# Patient Record
Sex: Female | Born: 1965 | Race: White | Hispanic: No | Marital: Married | Smoking: Never smoker
Health system: Southern US, Community
[De-identification: ages and names within clinical notes are randomized; demographics above are authoritative.]

## PROBLEM LIST (undated history)

## (undated) DIAGNOSIS — I10 Essential (primary) hypertension: Secondary | ICD-10-CM

## (undated) DIAGNOSIS — C801 Malignant (primary) neoplasm, unspecified: Secondary | ICD-10-CM

## (undated) HISTORY — PX: ABDOMINAL HYSTERECTOMY: SHX81

---

## 1997-10-02 ENCOUNTER — Ambulatory Visit (HOSPITAL_BASED_OUTPATIENT_CLINIC_OR_DEPARTMENT_OTHER): Admission: RE | Admit: 1997-10-02 | Discharge: 1997-10-02 | Payer: Self-pay | Admitting: General Surgery

## 1997-11-24 ENCOUNTER — Emergency Department (HOSPITAL_COMMUNITY): Admission: EM | Admit: 1997-11-24 | Discharge: 1997-11-24 | Payer: Self-pay | Admitting: Emergency Medicine

## 1998-02-10 ENCOUNTER — Ambulatory Visit (HOSPITAL_COMMUNITY): Admission: RE | Admit: 1998-02-10 | Discharge: 1998-02-10 | Payer: Self-pay | Admitting: Gastroenterology

## 1999-02-18 ENCOUNTER — Emergency Department (HOSPITAL_COMMUNITY): Admission: EM | Admit: 1999-02-18 | Discharge: 1999-02-18 | Payer: Self-pay | Admitting: Podiatry

## 1999-02-18 ENCOUNTER — Encounter: Payer: Self-pay | Admitting: General Surgery

## 1999-03-21 HISTORY — PX: NEPHRECTOMY TRANSPLANTED ORGAN: SUR880

## 2000-06-24 ENCOUNTER — Emergency Department (HOSPITAL_COMMUNITY): Admission: EM | Admit: 2000-06-24 | Discharge: 2000-06-24 | Payer: Self-pay

## 2020-12-30 ENCOUNTER — Emergency Department (HOSPITAL_COMMUNITY)
Admission: EM | Admit: 2020-12-30 | Discharge: 2020-12-30 | Disposition: A | Discharge status: Home / Self Care | Payer: No Typology Code available for payment source | Attending: Emergency Medicine | Admitting: Emergency Medicine

## 2020-12-30 ENCOUNTER — Encounter (HOSPITAL_COMMUNITY): Payer: Self-pay

## 2020-12-30 ENCOUNTER — Other Ambulatory Visit: Payer: Self-pay

## 2020-12-30 ENCOUNTER — Emergency Department (HOSPITAL_COMMUNITY): Payer: No Typology Code available for payment source

## 2020-12-30 DIAGNOSIS — M542 Cervicalgia: Secondary | ICD-10-CM | POA: Diagnosis present

## 2020-12-30 DIAGNOSIS — Z79899 Other long term (current) drug therapy: Secondary | ICD-10-CM | POA: Insufficient documentation

## 2020-12-30 DIAGNOSIS — Z85828 Personal history of other malignant neoplasm of skin: Secondary | ICD-10-CM | POA: Insufficient documentation

## 2020-12-30 DIAGNOSIS — I10 Essential (primary) hypertension: Secondary | ICD-10-CM | POA: Insufficient documentation

## 2020-12-30 DIAGNOSIS — R599 Enlarged lymph nodes, unspecified: Secondary | ICD-10-CM | POA: Diagnosis not present

## 2020-12-30 DIAGNOSIS — M5412 Radiculopathy, cervical region: Secondary | ICD-10-CM | POA: Diagnosis not present

## 2020-12-30 HISTORY — DX: Malignant (primary) neoplasm, unspecified: C80.1

## 2020-12-30 HISTORY — DX: Essential (primary) hypertension: I10

## 2020-12-30 LAB — COMPREHENSIVE METABOLIC PANEL
ALT: 13 U/L (ref 0–44)
AST: 14 U/L — ABNORMAL LOW (ref 15–41)
Albumin: 3.5 g/dL (ref 3.5–5.0)
Alkaline Phosphatase: 50 U/L (ref 38–126)
Anion gap: 7 (ref 5–15)
BUN: 23 mg/dL — ABNORMAL HIGH (ref 6–20)
CO2: 29 mmol/L (ref 22–32)
Calcium: 9 mg/dL (ref 8.9–10.3)
Chloride: 103 mmol/L (ref 98–111)
Creatinine, Ser: 1.5 mg/dL — ABNORMAL HIGH (ref 0.44–1.00)
GFR, Estimated: 40 mL/min — ABNORMAL LOW (ref >60)
Glucose, Bld: 112 mg/dL — ABNORMAL HIGH (ref 70–99)
Potassium: 3.5 mmol/L (ref 3.5–5.1)
Sodium: 139 mmol/L (ref 135–145)
Total Bilirubin: 0.3 mg/dL (ref 0.3–1.2)
Total Protein: 6.9 g/dL (ref 6.5–8.1)

## 2020-12-30 LAB — CBC WITH DIFFERENTIAL/PLATELET
Abs Immature Granulocytes: 0.02 10*3/uL (ref 0.00–0.07)
Basophils Absolute: 0 10*3/uL (ref 0.0–0.1)
Basophils Relative: 0 %
Eosinophils Absolute: 0.2 10*3/uL (ref 0.0–0.5)
Eosinophils Relative: 2 %
HCT: 34.1 % — ABNORMAL LOW (ref 36.0–46.0)
Hemoglobin: 10.7 g/dL — ABNORMAL LOW (ref 12.0–15.0)
Immature Granulocytes: 0 %
Lymphocytes Relative: 31 %
Lymphs Abs: 2.6 10*3/uL (ref 0.7–4.0)
MCH: 29.7 pg (ref 26.0–34.0)
MCHC: 31.4 g/dL (ref 30.0–36.0)
MCV: 94.7 fL (ref 80.0–100.0)
Monocytes Absolute: 0.7 10*3/uL (ref 0.1–1.0)
Monocytes Relative: 8 %
Neutro Abs: 5 10*3/uL (ref 1.7–7.7)
Neutrophils Relative %: 59 %
Platelets: 171 10*3/uL (ref 150–400)
RBC: 3.6 MIL/uL — ABNORMAL LOW (ref 3.87–5.11)
RDW: 13.5 % (ref 11.5–15.5)
WBC: 8.6 10*3/uL (ref 4.0–10.5)
nRBC: 0 % (ref 0.0–0.2)

## 2020-12-30 IMAGING — MR MR NECK SOFT TISSUE ONLY W/O CM
5 of 7 series · 28 of 48 positions shown · non-contrast
Comparison: None.

CLINICAL DATA: Metastatic disease evaluation; technologist note
states left neck pain, skin cancer diagnosis in this region post
surgery and awaiting radiation

EXAM:
MRI OF THE NECK WITHOUT CONTRAST
TECHNIQUE: Multiplanar, multisequence MR imaging of the neck was performed. No
intravenous contrast was administered.

[Series 5: T1 · coronal · 5.0mm · 0.47mm/px · 7 of 38 slices shown (1 of 2)]
[im 1/38]
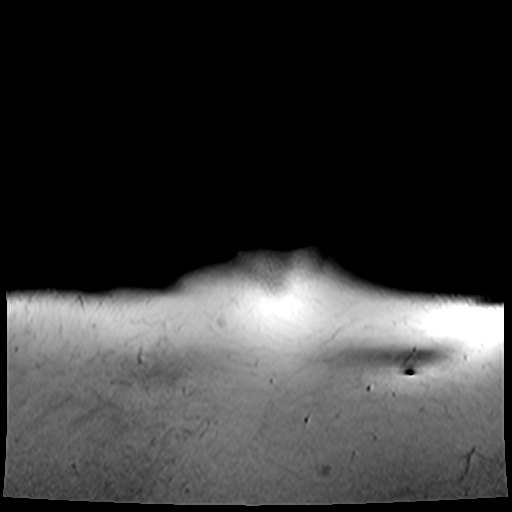
[im 7/38]
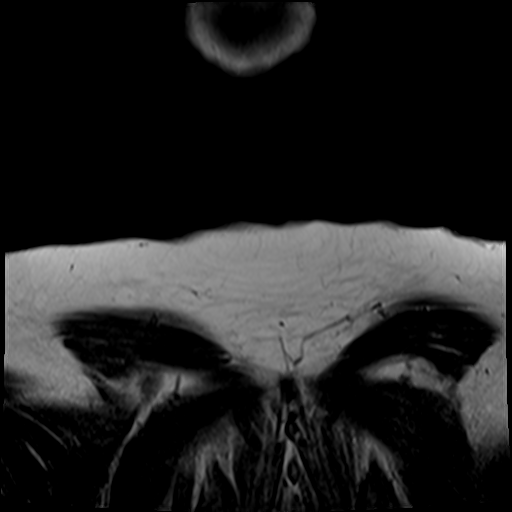
[im 13/38]
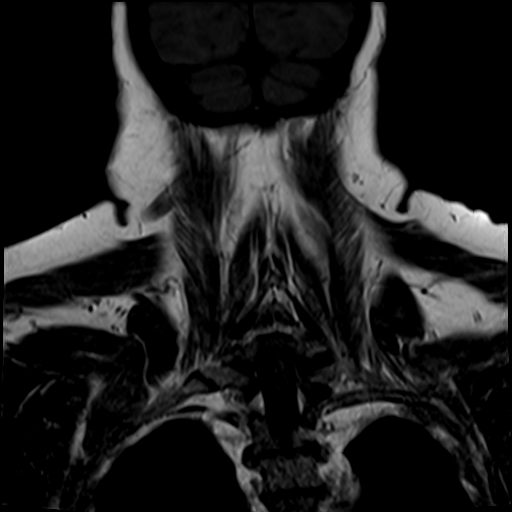
[im 19/38]
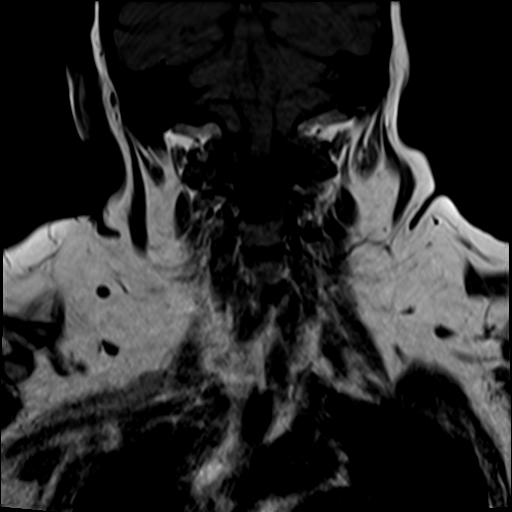
[im 25/38]
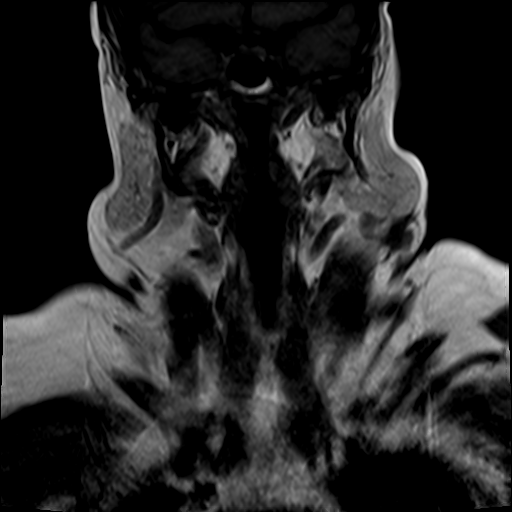
[im 31/38]
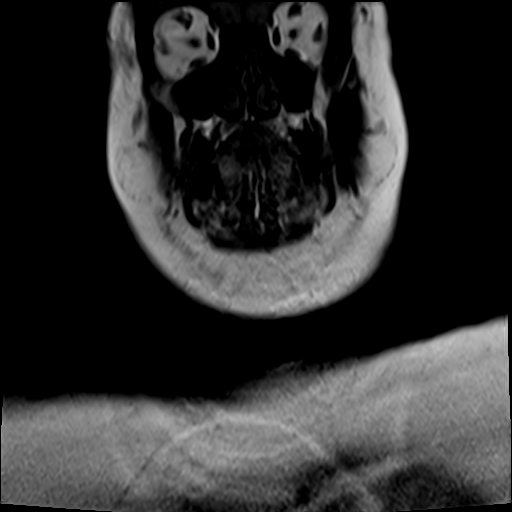
[im 38/38]
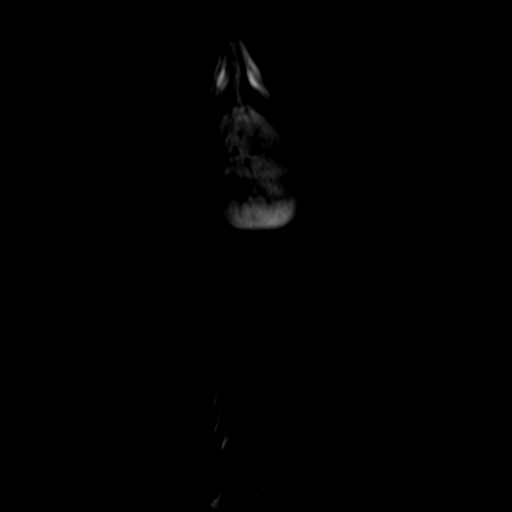

[Series 6: T1 · sagittal · 5.0mm · 0.47mm/px · 2 of 38 slices shown (2 of 2)]
[im 1/38]
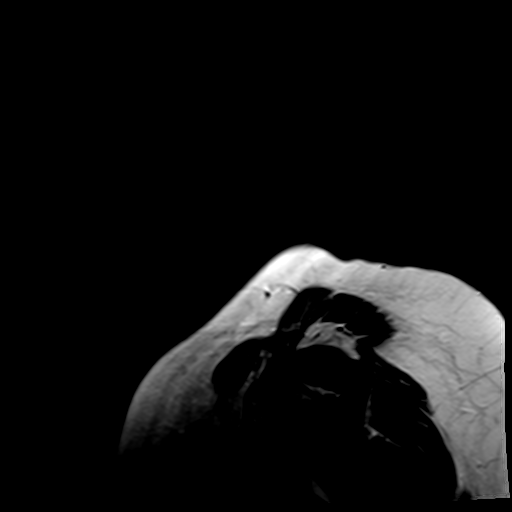
[im 6/38]
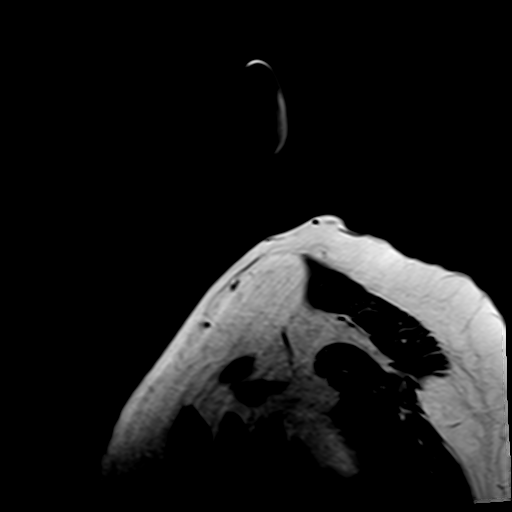

[Series 9: T2 fat-sat · coronal · 5.0mm · 0.94mm/px · 7 of 33 slices shown (1 of 3)]
[im 1/33]
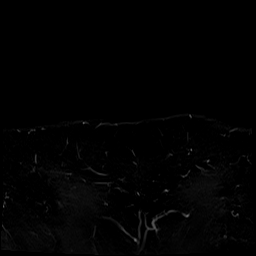
[im 6/33]
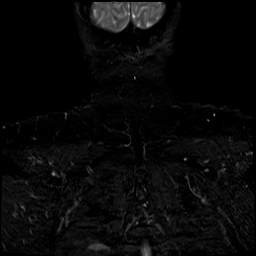
[im 11/33]
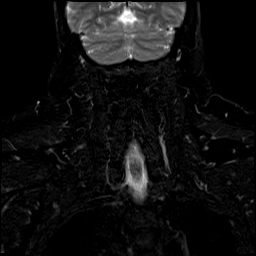
[im 17/33]
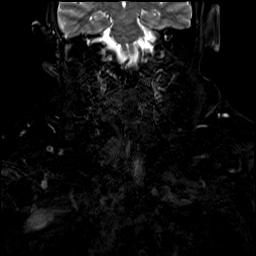
[im 22/33]
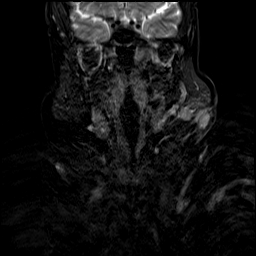
[im 27/33]
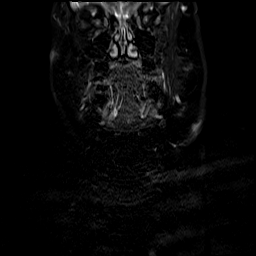
[im 33/33]
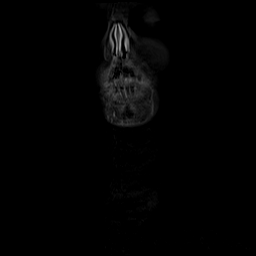

[Series 11: T2 fat-sat · sagittal · 5.0mm · 0.47mm/px · 6 of 28 slices shown (2 of 3)]
[im 1/28]
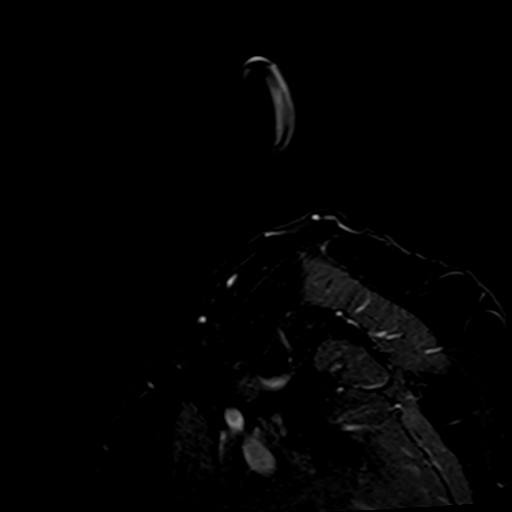
[im 6/28]
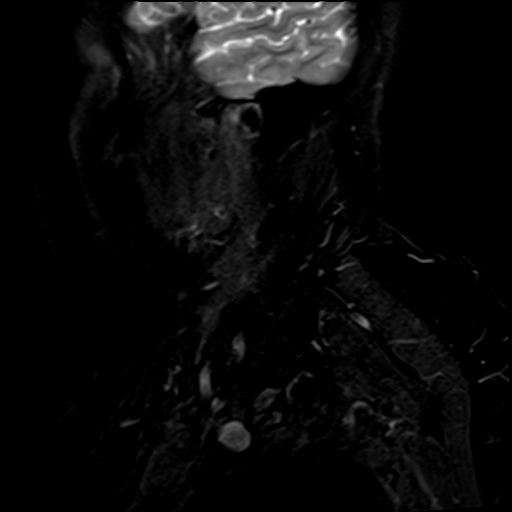
[im 11/28]
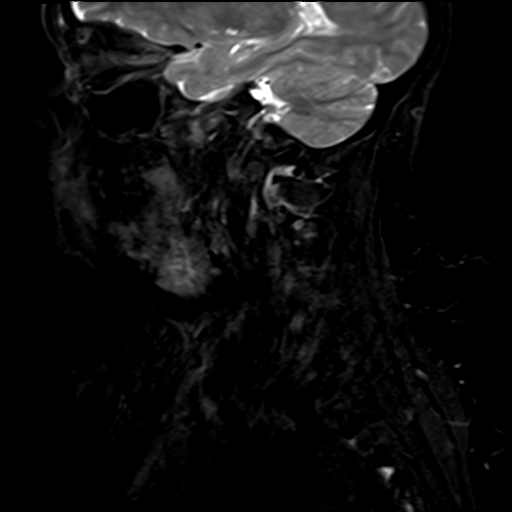
[im 17/28]
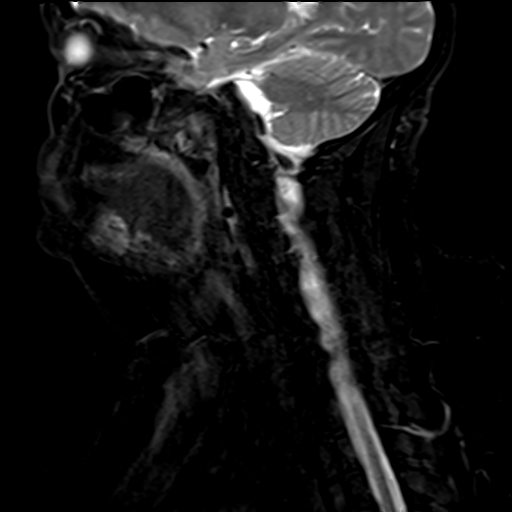
[im 22/28]
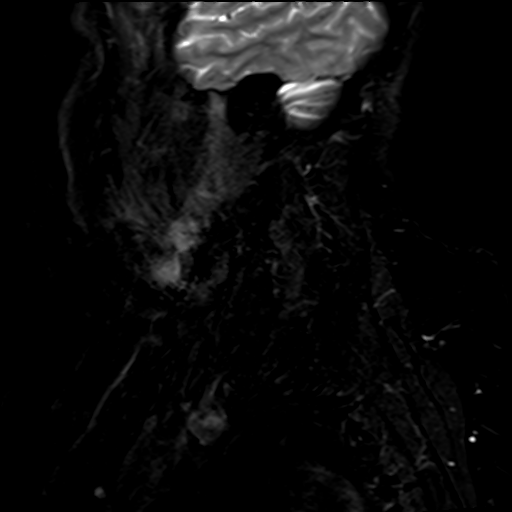
[im 28/28]
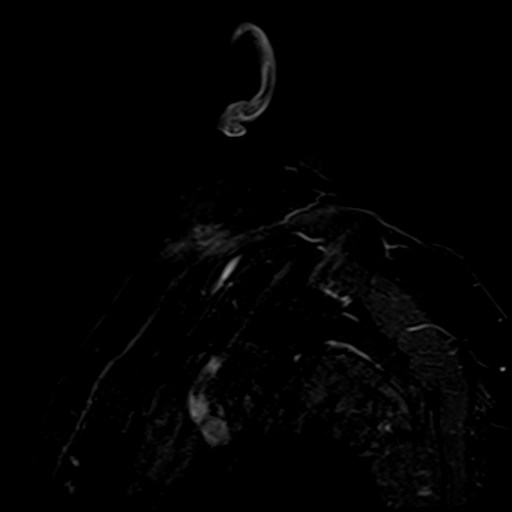

[Series 13: T2 fat-sat · axial · 5.0mm · 0.94mm/px · z∈[-56,+118]mm · 6 of 30 slices shown (3 of 3)]
[im 1/30]
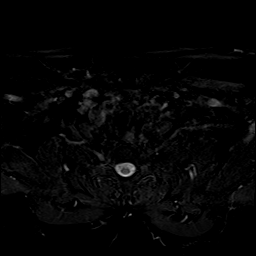
[im 6/30]
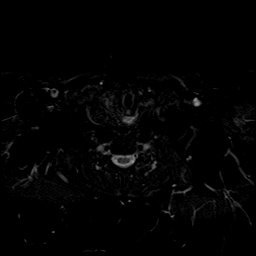
[im 12/30]
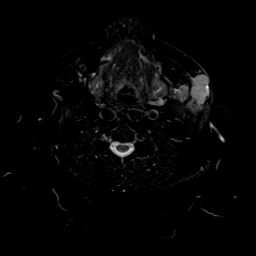
[im 18/30]
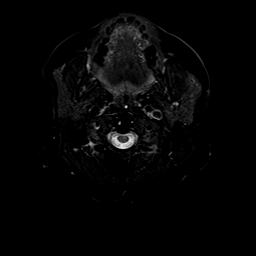
[im 24/30]
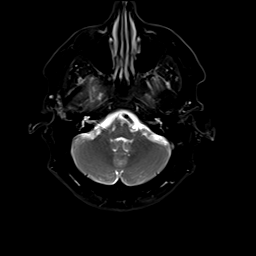
[im 30/30]
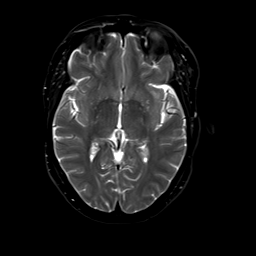

[28 of 48 positions shown; findings below may reference images not displayed]

FINDINGS: Motion artifact is present.

Pharynx and larynx: Unremarkable.  No mass or swelling.

Salivary glands: Parotid and submandibular glands are unremarkable.

Thyroid: Unremarkable.

Lymph nodes: There are enlarged left periparotid, submandibular, and
adjacent superficial lower facial enlarged lymph nodes. Largest
measures 2.4 cm. See annotated images on series 14. Indeterminate
nonenlarged superficial node more superiorly (series 14, image 21).

Vascular: Major neck vessels are patent. Retropharyngeal course of
the carotids.

Limited intracranial: Unremarkable.

Visualized orbits: Unremarkable.

Mastoids and visualized paranasal sinuses: No significant
opacification.

Skeleton: Marrow signal is within normal limits. Cervical spine is
better evaluated on same day dedicated imaging.

Upper chest: No apical lung mass.

Other: None.
IMPRESSION: Enlarged left periparotid, submandibular, and superficial lower
facial lymph nodes suspicious for metastatic disease. Additional
asymmetric indeterminate nonenlarged node more superiorly.

## 2020-12-30 IMAGING — MR MR CERVICAL SPINE W/O CM
4 of 7 series · 23 of 48 positions shown · non-contrast
Comparison: None

CLINICAL DATA: Bone lesion cervical spine. Suspect malignancy. Neck
pain

EXAM:
MRI CERVICAL SPINE WITHOUT CONTRAST
TECHNIQUE: Multiplanar, multisequence MR imaging of the cervical spine was
performed. No intravenous contrast was administered.

[Series 5: T1 · sagittal · 3.0mm · 0.86mm/px · 5 of 16 slices shown (1 of 2)]
[im 1/16]
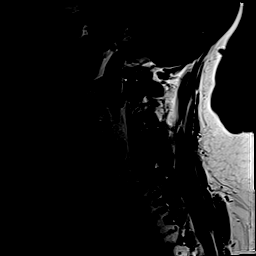
[im 4/16]
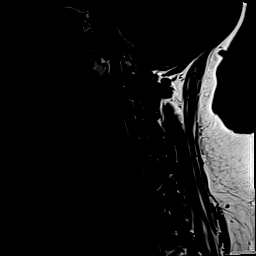
[im 8/16]
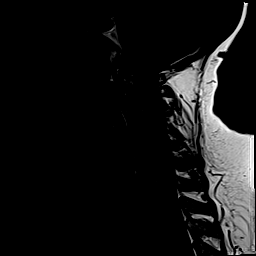
[im 12/16]
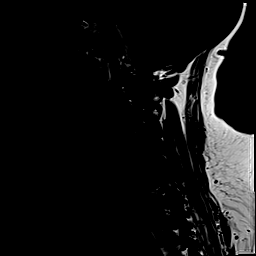
[im 16/16]
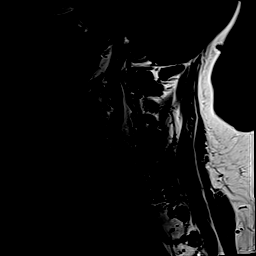

[Series 6: T2 · sagittal · 3.0mm · 0.43mm/px · 5 of 16 slices shown (1 of 2)]
[im 1/16]
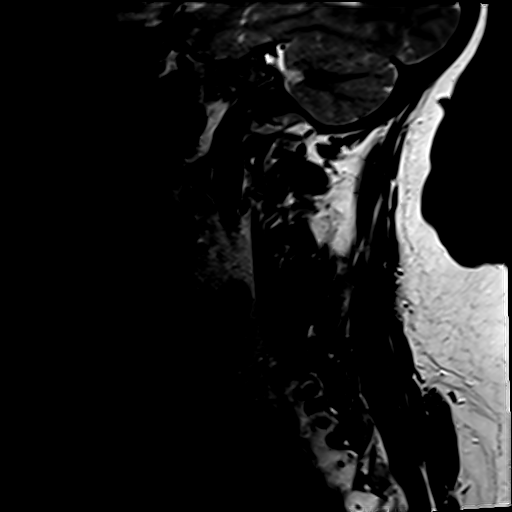
[im 4/16]
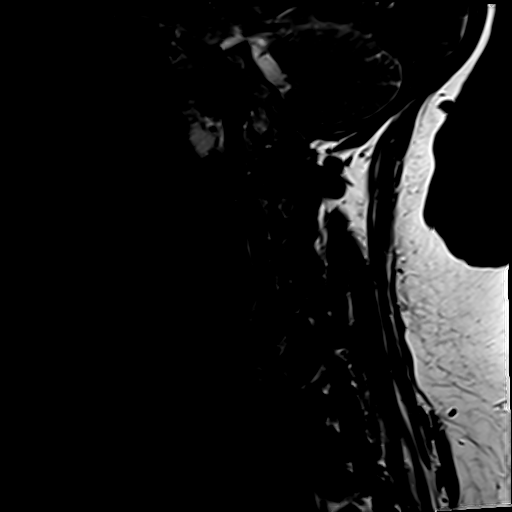
[im 8/16]
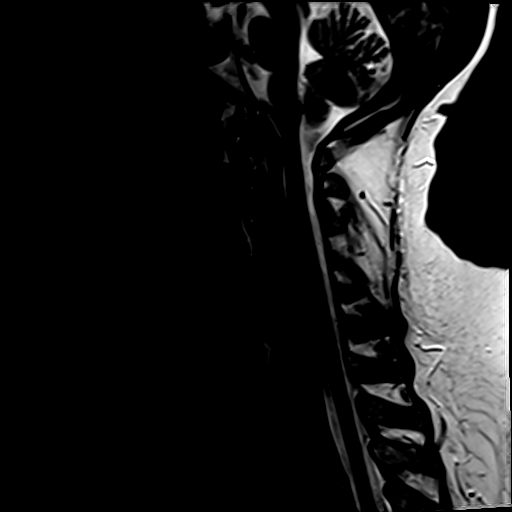
[im 12/16]
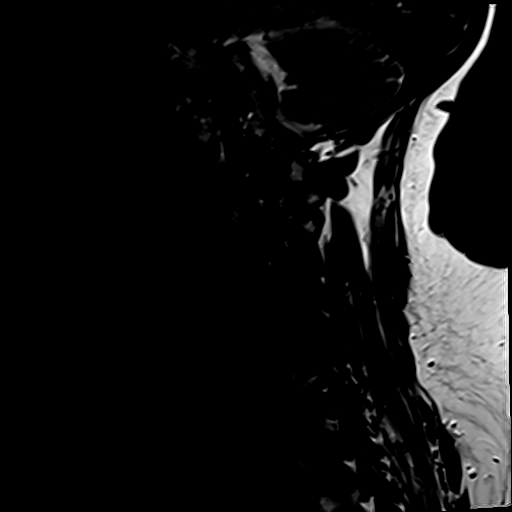
[im 16/16]
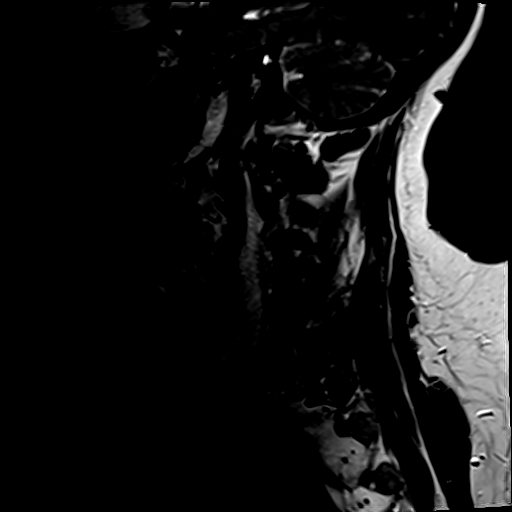

[Series 8: T2 · axial · 3.0mm · 0.35mm/px · z∈[-80,-2]mm · 9 of 25 slices shown (2 of 2)]
[im 1/25]
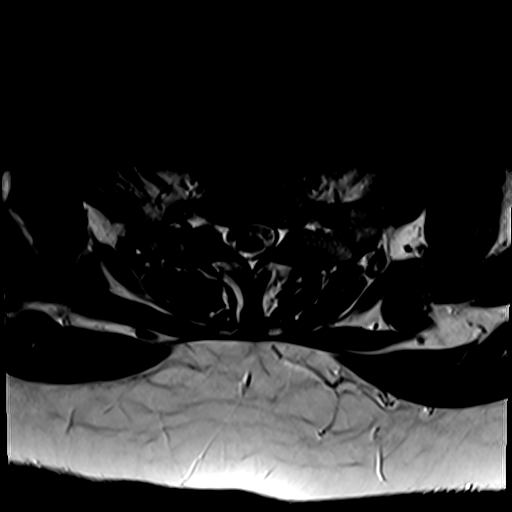
[im 4/25]
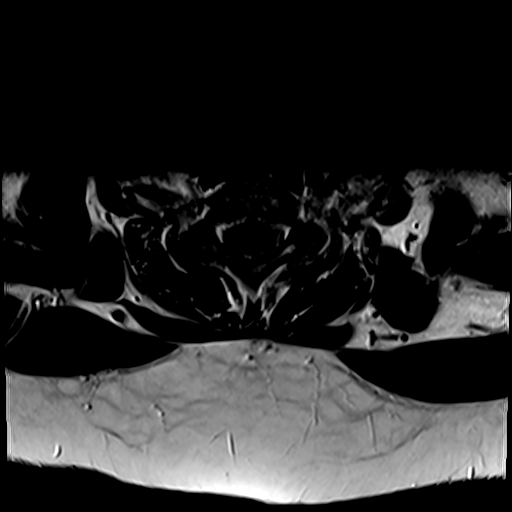
[im 7/25]
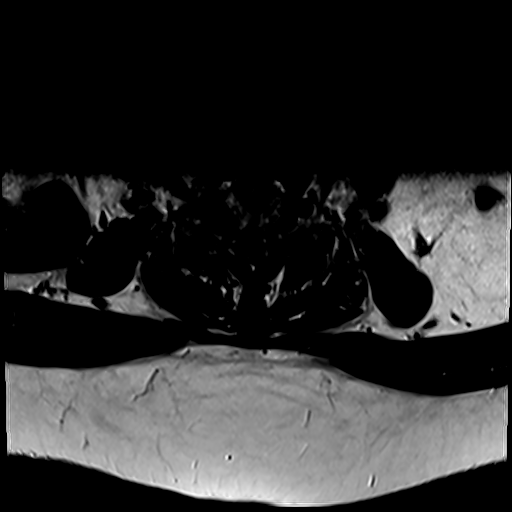
[im 10/25]
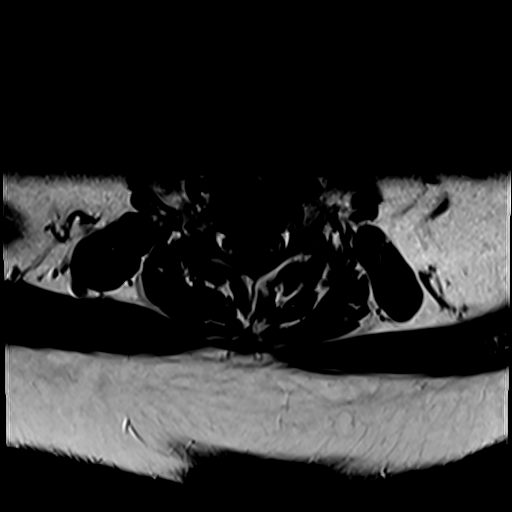
[im 13/25]
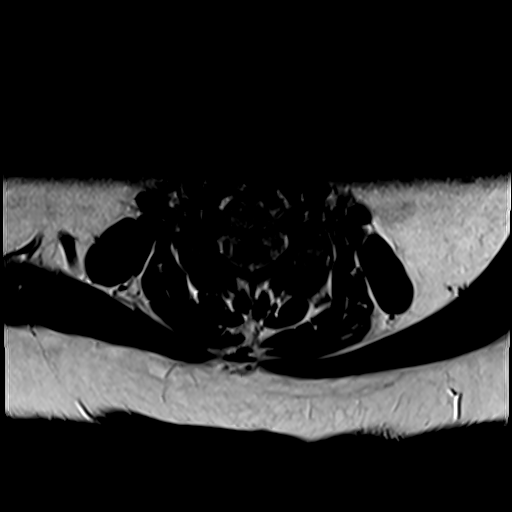
[im 16/25]
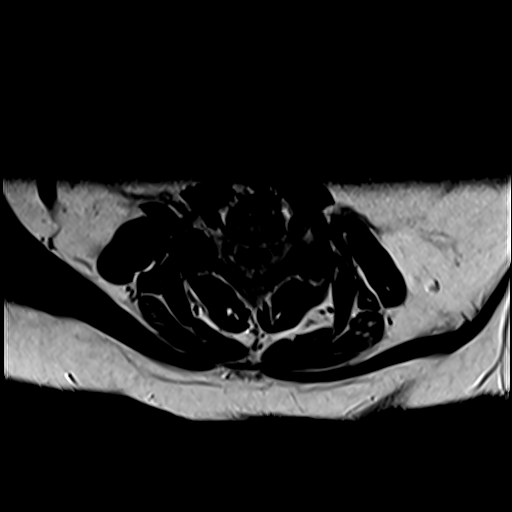
[im 19/25]
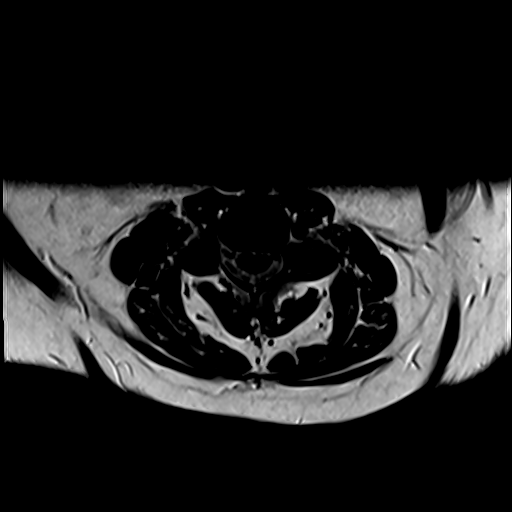
[im 22/25]
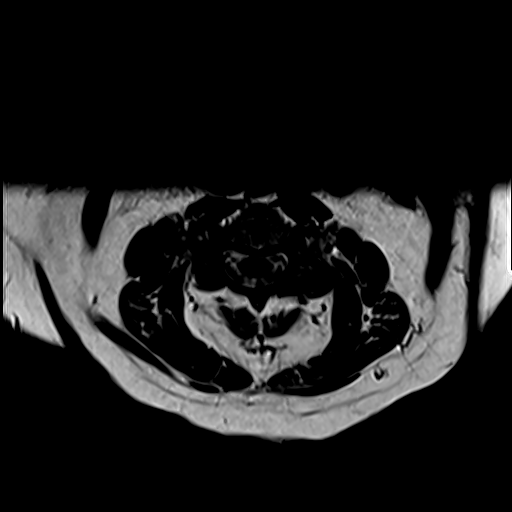
[im 25/25]
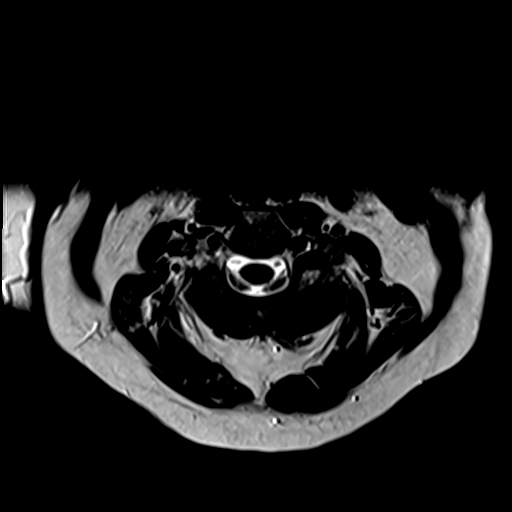

[Series 10: T1 · axial · 3.0mm · 0.35mm/px · z∈[-80,-12]mm · 4 of 25 slices shown (2 of 2)]
[im 1/25]
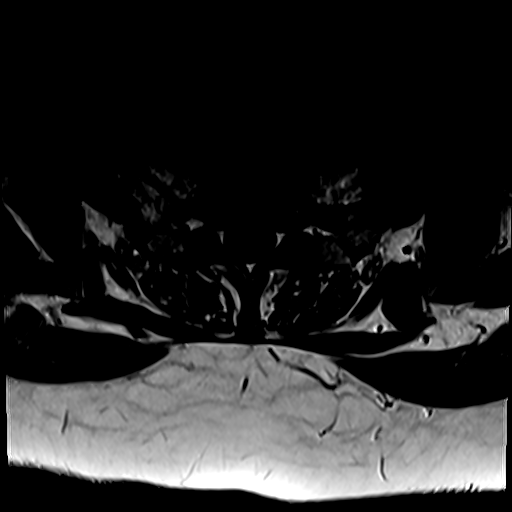
[im 4/25]
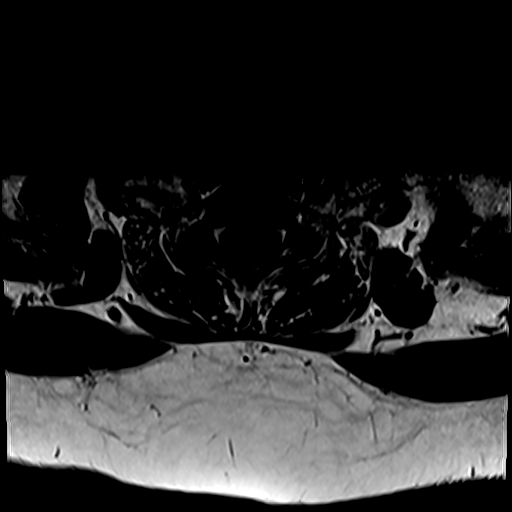
[im 13/25]
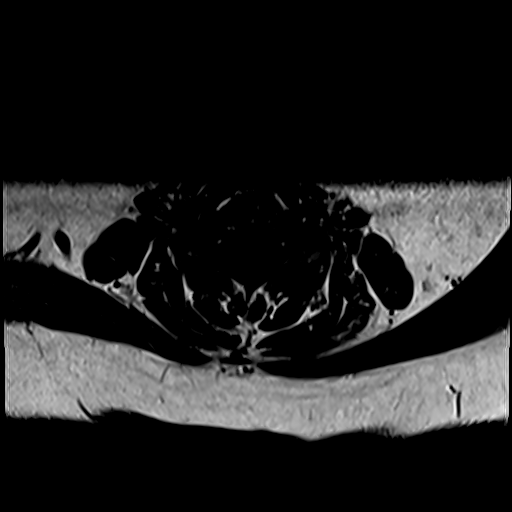
[im 22/25]
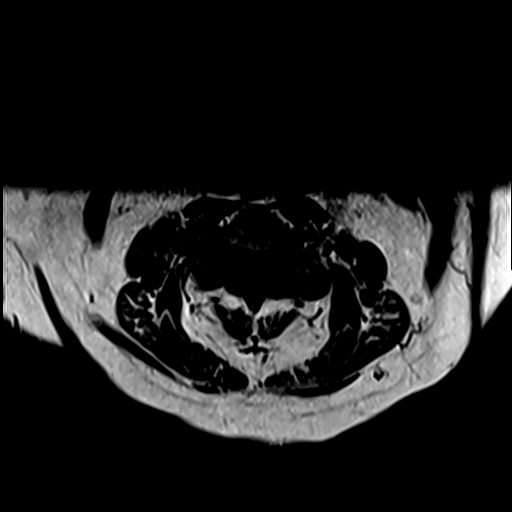

[23 of 48 positions shown; findings below may reference images not displayed]

FINDINGS: Alignment: Normal

Vertebrae: Normal bone marrow. Negative for fracture or mass in the
cervical spine

Cord: Normal signal and morphology

Posterior Fossa, vertebral arteries, paraspinal tissues: No soft
tissue mass or adenopathy in the neck. Refer to CT soft tissue neck
report.

Disc levels:

C2-3: Negative

C3-4: Mild disc degeneration and spurring without stenosis

C4-5: Shallow central disc protrusion. Mild flattening of the cord
without significant spinal stenosis. Neural foramina patent
bilaterally

C5-6: Mild disc degeneration. Negative for stenosis

C6-7: Disc degeneration with diffuse uncinate spurring. Moderate
foraminal narrowing bilaterally.

C7-T1: Negative
IMPRESSION: Negative for fracture or metastatic disease in the cervical spine

Multilevel cervical spine degenerative change as above. Moderate
foraminal narrowing bilaterally C6-7 due to spurring.

## 2020-12-30 MED ORDER — METHOCARBAMOL 500 MG PO TABS: 500.0000 mg | ORAL_TABLET | Freq: Two times a day (BID) | ORAL | 0 refills | Status: DC

## 2020-12-30 MED ORDER — HYDROMORPHONE HCL 1 MG/ML IJ SOLN
0.5000 mg | Freq: Once | INTRAMUSCULAR | Status: AC
  Administered 2020-12-30: 0.5 mg via INTRAVENOUS
  Filled 2020-12-30: qty 1

## 2020-12-30 MED ORDER — HYDROCODONE-ACETAMINOPHEN 5-325 MG PO TABS: 2.0000 | ORAL_TABLET | ORAL | 0 refills | Status: DC | PRN

## 2020-12-30 MED ORDER — HYDROCODONE-ACETAMINOPHEN 5-325 MG PO TABS
1.0000 | ORAL_TABLET | Freq: Once | ORAL | Status: AC
  Administered 2020-12-30: 1 via ORAL
  Filled 2020-12-30: qty 1

## 2020-12-30 MED ORDER — PREDNISONE 20 MG PO TABS: 60.0000 mg | ORAL_TABLET | Freq: Every day | ORAL | 0 refills | Status: AC

## 2020-12-30 MED ORDER — ONDANSETRON HCL 4 MG/2ML IJ SOLN
4.0000 mg | Freq: Once | INTRAMUSCULAR | Status: AC
  Administered 2020-12-30: 4 mg via INTRAVENOUS
  Filled 2020-12-30: qty 2

## 2020-12-30 MED ORDER — HYDROCODONE-ACETAMINOPHEN 5-325 MG PO TABS: 1.0000 | ORAL_TABLET | Freq: Four times a day (QID) | ORAL | 0 refills | Status: DC | PRN

## 2020-12-30 MED ORDER — PREDNISONE 20 MG PO TABS
60.0000 mg | ORAL_TABLET | Freq: Once | ORAL | Status: AC
  Administered 2020-12-30: 60 mg via ORAL
  Filled 2020-12-30: qty 3

## 2020-12-30 NOTE — ED Provider Notes (Signed)
Tasha Brown Provider Note   CSN: [id number] Arrival date & time: 12/30/20  1011     History Chief Complaint  Patient presents with   Neck Pain    Tasha Brown is a 55 y.o. female.  Tasha Brown is a 55 y.o. female with a history of squamous cell skin cancer and hypertension, who presents for evaluation of neck pain. Pt reports she has been having pain in the posterior and left side of her neck for the past 5 days.  She reports pain has been constant and worsening and it is worse with movement of her upper extremities.  She denies any associated injury or trauma.  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  She reports over the past few weeks she has noticed increasing lumps on the left side of her neck where her initial skin cancer was removed and her oncologist in [place] was concern for spread of cancer she was supposed to have a PET scan and then start radiation, but tried to move to [place] to be closer for her to her daughter and continue care through the [place] here locally but was told that it would take several months to get [facility]  She has since developed this severe neck pain.  And feels that her arms have become somewhat weak.  She has had some intermittent tingling but no numbness.  No associated headache or vision changes.  No changes in speech.  No numbness tingling or weakness in her lower extremities and no pain in her lower extremities.  She has tried over-the-counter pain medications as well as a single dose of leftover pain medication that she had without improvement.  She went to a chiropractor but did not get any relief.  No other aggravating or alleviating factors.  The history is provided by the patient and a relative.      Past Medical History:  Diagnosis Date   Cancer  ([place])    Hypertension     There are no problems to display for this patient.   Past Surgical History:  Procedure Laterality Date   ABDOMINAL HYSTERECTOMY     NEPHRECTOMY TRANSPLANTED ORGAN  2001     OB History   No obstetric history on file.     History reviewed. No pertinent family history.  Social History   Tobacco Use   Smoking status: Never   Smokeless tobacco: Never  Substance Use Topics   Alcohol use: Never   Drug use: Never    Home Medications Prior to Admission medications   Medication Sig Start Date End Date Taking? Authorizing Provider  calcitRIOL (ROCALTROL) 0.25 MCG capsule Take 0.25 mcg by mouth 3 (three) times a week. Mon, Wed, Saturday   Yes [provider]  Cholecalciferol (VITAMIN D) 50 MCG (2000 UT) CAPS Take 2,000 Units by mouth daily.   Yes [provider]  methocarbamol (ROBAXIN) 500 MG tablet Take 1 tablet (500 mg total) by mouth 2 (two) times daily. 12/30/20  Yes Jacqlyn Larsen, PA-C  metoprolol tartrate (LOPRESSOR) 100 MG tablet Take 100 mg by mouth 2 (two) times daily.   Yes [provider]  mycophenolate (MYFORTIC) 180 MG EC tablet Take 180 mg by mouth 2 (two) times daily.   Yes [provider]  Niacinamide-Zn-Cu-Methfo-Se-Cr (NICOTINAMIDE PO) Take 500 mg by mouth  2 (two) times daily.   Yes [provider]  predniSONE (DELTASONE) 20 MG tablet Take 3 tablets (60 mg total) by mouth daily for 5 days. 12/30/20 01/04/21 Yes Jacqlyn Larsen, PA-C  predniSONE (DELTASONE) 5 MG tablet Take 5 mg by mouth daily with breakfast.   Yes [provider]  tacrolimus (PROGRAF) 0.5 MG capsule Take 1.5 mg by mouth 2 (two) times daily.   Yes [provider]  HYDROcodone-acetaminophen (NORCO) 5-325 MG tablet Take 1 tablet by mouth every 6 (six) hours as needed. 12/30/20   Jacqlyn Larsen, PA-C    Allergies    Penicillins, Contrast media [iodinated diagnostic agents], Decadron [dexamethasone], Tetracyclines  & related, and Erythromycin  Review of Systems   Review of Systems  Constitutional:  Negative for chills and fever.  HENT: Negative.    Eyes:  Negative for visual disturbance.  Respiratory:  Negative for cough and shortness of breath.   Cardiovascular:  Negative for chest pain.  Gastrointestinal:  Negative for abdominal pain, nausea and vomiting.  Genitourinary:  Negative for dysuria.  Musculoskeletal:  Positive for neck pain. Negative for arthralgias, back pain, joint swelling and neck stiffness.  Skin:  Negative for color change and rash.  Neurological:  Positive for weakness. Negative for dizziness, syncope, speech difficulty, numbness and headaches.   Physical Exam Updated Vital Signs BP 136/76   Pulse (!) 59   Temp 97.9 F (36.6 C) (Oral)   Resp 16   Ht 5\' 3"  (1.6 m)   Wt 120.7 kg   SpO2 94%   BMI 47.12 kg/m   Physical Exam Vitals and nursing note reviewed.  Constitutional:      General: She is not in acute distress.    Appearance: Normal appearance. She is well-developed. She is not diaphoretic.     Comments: Alert, chronically ill appearing, but in no acute distress  HENT:     Head: Normocephalic and atraumatic.  Eyes:     General:        Right eye: No discharge.        Left eye: No discharge.     Pupils: Pupils are equal, round, and reactive to light.  Neck:     Comments: There is posterior c-spine tenderness with no palpable deformity. There is also tenderness over the left later neck with visible surgucal scar and two palpable soft tissue masses in this area, no overlying erythema, mildly tender. Cardiovascular:     Rate and Rhythm: Normal rate and regular rhythm.     Pulses: Normal pulses.     Heart sounds: Normal heart sounds.  Pulmonary:     Effort: Pulmonary effort is normal. No respiratory distress.     Breath sounds: Normal breath sounds. No wheezing or rales.     Comments: Respirations equal and unlabored, patient able to speak in full sentences,  lungs clear to auscultation bilaterally  Abdominal:     General: Bowel sounds are normal. There is no distension.     Palpations: Abdomen is soft. There is no mass.     Tenderness: There is no abdominal tenderness. There is no guarding.     Comments: Abdomen soft, nondistended, nontender to palpation in all quadrants without guarding or peritoneal signs  Musculoskeletal:        General: No deformity.     Cervical back: Tenderness present.     Comments: No midline thoracic or lumbar spinal tenderness  Skin:    General: Skin is warm and dry.  Capillary Refill: Capillary refill takes less than 2 seconds.  Neurological:     Mental Status: She is alert and oriented to person, place, and time.     Coordination: Coordination normal.     Comments: Speech is clear, able to follow commands CN III-XII intact 4/5 strength in left upper extremity, 5/5 strength in all other extremities Sensation normal to light and sharp touch Moves extremities without ataxia, coordination intact  Psychiatric:        Mood and Affect: Mood normal.        Behavior: Behavior normal.    ED Results / Procedures / Treatments   Labs (all labs ordered are listed, but only abnormal results are displayed) Labs Reviewed  COMPREHENSIVE METABOLIC PANEL - Abnormal; Notable for the following components:      Result Value   Glucose, Bld 112 (*)    BUN 23 (*)    Creatinine, Ser 1.50 (*)    AST 14 (*)    GFR, Estimated 40 (*)    All other components within normal limits  CBC WITH DIFFERENTIAL/PLATELET - Abnormal; Notable for the following components:   RBC 3.60 (*)    Hemoglobin 10.7 (*)    HCT 34.1 (*)    All other components within normal limits    EKG None  Radiology MR Cervical Spine Wo Contrast  Result Date: 12/30/2020 CLINICAL DATA:  Bone lesion cervical spine. Suspect malignancy. Neck pain EXAM: MRI CERVICAL SPINE WITHOUT CONTRAST TECHNIQUE: Multiplanar, multisequence MR imaging of the cervical spine  was performed. No intravenous contrast was administered. COMPARISON:  None FINDINGS: Alignment: Normal Vertebrae: Normal bone marrow. Negative for fracture or mass in the cervical spine Cord: Normal signal and morphology Posterior Fossa, vertebral arteries, paraspinal tissues: No soft tissue mass or adenopathy in the neck. Refer to CT soft tissue neck report. Disc levels: C2-3: Negative C3-4: Mild disc degeneration and spurring without stenosis C4-5: Shallow central disc protrusion. Mild flattening of the cord without significant spinal stenosis. Neural foramina patent bilaterally C5-6: Mild disc degeneration. Negative for stenosis C6-7: Disc degeneration with diffuse uncinate spurring. Moderate foraminal narrowing bilaterally. C7-T1: Negative IMPRESSION: Negative for fracture or metastatic disease in the cervical spine Multilevel cervical spine degenerative change as above. Moderate foraminal narrowing bilaterally C6-7 due to spurring. Electronically Signed   By: Franchot Gallo M.D.   On: 12/30/2020 14:07   MR NECK SOFT TISSUE ONLY WO CONTRAST  Result Date: 12/30/2020 CLINICAL DATA:  Metastatic disease evaluation; technologist note states left neck pain, skin cancer diagnosis in this region post surgery and awaiting radiation EXAM: MRI OF THE NECK WITHOUT CONTRAST TECHNIQUE: Multiplanar, multisequence MR imaging of the neck was performed. No intravenous contrast was administered. COMPARISON:  None. FINDINGS: Motion artifact is present. Pharynx and larynx: Unremarkable.  No mass or swelling. Salivary glands: Parotid and submandibular glands are unremarkable. Thyroid: Unremarkable. Lymph nodes: There are enlarged left periparotid, submandibular, and adjacent superficial lower facial enlarged lymph nodes. Largest measures 2.4 cm. See annotated images on series 14. Indeterminate nonenlarged superficial node more superiorly (series 14, image 21). Vascular: Major neck vessels are patent. Retropharyngeal course of  the carotids. Limited intracranial: Unremarkable. Visualized orbits: Unremarkable. Mastoids and visualized paranasal sinuses: No significant opacification. Skeleton: Marrow signal is within normal limits. Cervical spine is better evaluated on same day dedicated imaging. Upper chest: No apical lung mass. Other: None. IMPRESSION: Enlarged left periparotid, submandibular, and superficial lower facial lymph nodes suspicious for metastatic disease. Additional asymmetric indeterminate nonenlarged node more superiorly. Electronically  Signed   By: Macy Mis M.D.   On: 12/30/2020 14:36     Procedures Procedures   Medications Ordered in ED Medications  ondansetron (ZOFRAN) injection 4 mg (4 mg Intravenous Given 12/30/20 1130)  HYDROmorphone (DILAUDID) injection 0.5 mg (0.5 mg Intravenous Given 12/30/20 1131)  predniSONE (DELTASONE) tablet 60 mg (60 mg Oral Given 12/30/20 1503)  HYDROcodone-acetaminophen (NORCO/VICODIN) 5-325 MG per tablet 1 tablet (1 tablet Oral Given 12/30/20 1504)    ED Course  I have reviewed the triage vital signs and the nursing notes.  Pertinent labs & imaging results that were available during my care of the patient were reviewed by me and considered in my medical decision making (see chart for details).    MDM Rules/Calculators/A&P                           55 year old female with history of metastatic squamous cell cancer presents with severe posterior neck pain.  On arrival she is hypertensive but vitals otherwise unremarkable.  She appears very uncomfortable with significant tenderness over the posterior aspect of her neck and limited range of motion of the upper extremities with some weakness noted in particular in the left upper extremity.  She also has left lateral neck tenderness with 2 palpable soft tissue masses in the region where initial skin cancer was removed.  Concern for potential cervical compression from degenerative disc disease versus metastasis from  cancer.  Given neurologic deficits we will get MRI of the cervical spine.  Patient cannot receive contrast as she is status post renal transplant so we will also get MRI of the soft tissue neck.  Pain treated here in the ED.  I have independently ordered, reviewed and interpreted all labs and imaging: CBC: No leukocytosis, stable hemoglobin CMP: Creatinine of 1.5 with BUN of 23, no other electrolyte derangements  MRI of the soft tissue neck with enlarged left periparotid, submandibular and superficial lower facial lymph nodes suspicious for metastatic disease  MRI of the cervical spine with no evidence of fracture or metastatic disease, patient with multilevel cervical spine degenerative changes with moderate foraminal narrowing bilaterally at C6-7 due to spurring, there is a shallow central disc protrusion at C4-5 with mild flattening of the cord but no abnormal cord signal and no significant spinal stenosis noted.  Imaging is overall reassuring, does not show acute cord compression or evidence of metastatic disease in the cervical spine.  Due to issues getting follow-up care at the [facility] here locally she will be returning to [place] to continue care with her [facility] oncologist there.  Patient will also request follow-up with neurosurgery for further evaluation of degenerative disc disease in the cervical spine.  On reevaluation after pain control patient's pain has significantly improved and with that she has improved strength and range of motion bilateral upper extremities now with no focal neurologic deficit.  We will treat with pain medication, muscle relaxers and steroids for the next few days and have patient follow-up as planned.  Return precautions provided and patient and daughter expressed understanding and agreement with plan.  Final Clinical Impression(s) / ED Diagnoses Final diagnoses:  Neck pain  Cervical radiculopathy  Enlarged lymph nodes    Rx / DC Orders ED Discharge Orders           Ordered    predniSONE (DELTASONE) 20 MG tablet  Daily        12/30/20 1458    methocarbamol (ROBAXIN) 500  MG tablet  2 times daily        12/30/20 1458    HYDROcodone-acetaminophen (NORCO) 5-325 MG tablet  Every 4 hours PRN,   Status:  Discontinued        12/30/20 1458    HYDROcodone-acetaminophen (NORCO) 5-325 MG tablet  Every 6 hours PRN        12/30/20 1519             Jacqlyn Larsen, [place] 01/01/21 1900    Malvin Johns, MD 01/03/21 1422

## 2020-12-30 NOTE — ED Notes (Signed)
Called lab to check on status of CMP, per lab it is still in process. No issues with specimen, but equipment in lab is still down.

## 2020-12-30 NOTE — ED Notes (Signed)
Pt transported to MRI 

## 2020-12-30 NOTE — Discharge Instructions (Signed)
Once you return to [place] you will need to follow-up with your oncologist, the MRI of your soft tissue neck shows enlarged lymph nodes on the left that you are feeling on palpation that are likely cancerous, we did not see any other cancerous masses or signs of cancerous lesions in your cervical spine.  Your cervical spine shows some disc bulging and degenerative disc disease that I suspect is causing your neck pain.  To help treat this pain and take prednisone daily for the next 5 days starting tomorrow.  You can also use prescribed pain medication.  Please use this with caution as it can cause drowsiness and you can use muscle relaxer as needed.  You can also continue to use topical lidocaine patches, ice and heat.  When you return to [place] you should ask your regular doctor to refer you to a spine specialist for further evaluation of this.  If you develop persistent numbness or weakness you should return for reevaluation.

## 2020-12-30 NOTE — ED Notes (Signed)
Pt states she is not claustrophobic

## 2020-12-30 NOTE — ED Notes (Signed)
Pt remains at MRI

## 2020-12-30 NOTE — ED Triage Notes (Addendum)
Pt BIBA from home. Pt started having cervical neck pain to the left side of her neck. Pt was dx with skin CA in that area in February that she has had surgery on 2x. Pt is supposed to start radiation soon. (Pt recently moved here from [place]) Pt is a [place] patient, which has been shut down temporarily. No relief with lidocaine patch in place.

## 2021-06-27 ENCOUNTER — Other Ambulatory Visit: Payer: Self-pay

## 2021-06-27 ENCOUNTER — Encounter (HOSPITAL_COMMUNITY): Payer: Self-pay

## 2021-06-27 ENCOUNTER — Emergency Department (HOSPITAL_COMMUNITY): Payer: Non-veteran care

## 2021-06-27 ENCOUNTER — Inpatient Hospital Stay (HOSPITAL_COMMUNITY)
Admission: EM | Admit: 2021-06-27 | Discharge: 2021-06-29 | DRG: 690 | Disposition: A | Discharge status: Home / Self Care | Payer: Non-veteran care | Attending: Student | Admitting: Student

## 2021-06-27 DIAGNOSIS — Z91048 Other nonmedicinal substance allergy status: Secondary | ICD-10-CM

## 2021-06-27 DIAGNOSIS — I129 Hypertensive chronic kidney disease with stage 1 through stage 4 chronic kidney disease, or unspecified chronic kidney disease: Secondary | ICD-10-CM | POA: Diagnosis not present

## 2021-06-27 DIAGNOSIS — Z87440 Personal history of urinary (tract) infections: Secondary | ICD-10-CM | POA: Diagnosis not present

## 2021-06-27 DIAGNOSIS — Z885 Allergy status to narcotic agent status: Secondary | ICD-10-CM | POA: Diagnosis not present

## 2021-06-27 DIAGNOSIS — N1832 Chronic kidney disease, stage 3b: Secondary | ICD-10-CM

## 2021-06-27 DIAGNOSIS — Z6841 Body Mass Index (BMI) 40.0 and over, adult: Secondary | ICD-10-CM

## 2021-06-27 DIAGNOSIS — Z91041 Radiographic dye allergy status: Secondary | ICD-10-CM | POA: Diagnosis not present

## 2021-06-27 DIAGNOSIS — Z880 Allergy status to penicillin: Secondary | ICD-10-CM | POA: Diagnosis not present

## 2021-06-27 DIAGNOSIS — Z9049 Acquired absence of other specified parts of digestive tract: Secondary | ICD-10-CM | POA: Diagnosis not present

## 2021-06-27 DIAGNOSIS — N12 Tubulo-interstitial nephritis, not specified as acute or chronic: Secondary | ICD-10-CM

## 2021-06-27 DIAGNOSIS — Z940 Kidney transplant status: Secondary | ICD-10-CM | POA: Diagnosis not present

## 2021-06-27 DIAGNOSIS — Z79899 Other long term (current) drug therapy: Secondary | ICD-10-CM

## 2021-06-27 DIAGNOSIS — N390 Urinary tract infection, site not specified: Secondary | ICD-10-CM | POA: Diagnosis not present

## 2021-06-27 DIAGNOSIS — Z888 Allergy status to other drugs, medicaments and biological substances status: Secondary | ICD-10-CM | POA: Diagnosis not present

## 2021-06-27 DIAGNOSIS — D638 Anemia in other chronic diseases classified elsewhere: Secondary | ICD-10-CM

## 2021-06-27 DIAGNOSIS — N261 Atrophy of kidney (terminal): Secondary | ICD-10-CM | POA: Diagnosis not present

## 2021-06-27 DIAGNOSIS — D631 Anemia in chronic kidney disease: Secondary | ICD-10-CM | POA: Diagnosis present

## 2021-06-27 DIAGNOSIS — Z90710 Acquired absence of both cervix and uterus: Secondary | ICD-10-CM | POA: Diagnosis not present

## 2021-06-27 DIAGNOSIS — Z7952 Long term (current) use of systemic steroids: Secondary | ICD-10-CM | POA: Diagnosis not present

## 2021-06-27 DIAGNOSIS — E66813 Obesity, class 3: Secondary | ICD-10-CM

## 2021-06-27 DIAGNOSIS — N281 Cyst of kidney, acquired: Secondary | ICD-10-CM | POA: Diagnosis not present

## 2021-06-27 DIAGNOSIS — Z91013 Allergy to seafood: Secondary | ICD-10-CM | POA: Diagnosis not present

## 2021-06-27 DIAGNOSIS — Z9102 Food additives allergy status: Secondary | ICD-10-CM | POA: Diagnosis not present

## 2021-06-27 DIAGNOSIS — Z91040 Latex allergy status: Secondary | ICD-10-CM

## 2021-06-27 DIAGNOSIS — E871 Hypo-osmolality and hyponatremia: Secondary | ICD-10-CM

## 2021-06-27 DIAGNOSIS — Z881 Allergy status to other antibiotic agents status: Secondary | ICD-10-CM | POA: Diagnosis not present

## 2021-06-27 DIAGNOSIS — N3000 Acute cystitis without hematuria: Secondary | ICD-10-CM | POA: Diagnosis not present

## 2021-06-27 DIAGNOSIS — Z887 Allergy status to serum and vaccine status: Secondary | ICD-10-CM | POA: Diagnosis not present

## 2021-06-27 LAB — URINALYSIS, ROUTINE W REFLEX MICROSCOPIC
Bacteria, UA: NONE SEEN
Bilirubin Urine: NEGATIVE
Glucose, UA: NEGATIVE mg/dL
Ketones, ur: NEGATIVE mg/dL
Nitrite: NEGATIVE
Protein, ur: 30 mg/dL — AB
Specific Gravity, Urine: 1.008 (ref 1.005–1.030)
pH: 5 (ref 5.0–8.0)

## 2021-06-27 LAB — COMPREHENSIVE METABOLIC PANEL
ALT: 16 U/L (ref 0–44)
AST: 19 U/L (ref 15–41)
Albumin: 4 g/dL (ref 3.5–5.0)
Alkaline Phosphatase: 49 U/L (ref 38–126)
Anion gap: 5 (ref 5–15)
BUN: 29 mg/dL — ABNORMAL HIGH (ref 6–20)
CO2: 27 mmol/L (ref 22–32)
Calcium: 9.1 mg/dL (ref 8.9–10.3)
Chloride: 102 mmol/L (ref 98–111)
Creatinine, Ser: 1.5 mg/dL — ABNORMAL HIGH (ref 0.44–1.00)
GFR, Estimated: 40 mL/min — ABNORMAL LOW (ref >60)
Glucose, Bld: 101 mg/dL — ABNORMAL HIGH (ref 70–99)
Potassium: 3.8 mmol/L (ref 3.5–5.1)
Sodium: 134 mmol/L — ABNORMAL LOW (ref 135–145)
Total Bilirubin: 0.6 mg/dL (ref 0.3–1.2)
Total Protein: 7.2 g/dL (ref 6.5–8.1)

## 2021-06-27 LAB — CBC WITH DIFFERENTIAL/PLATELET
Abs Immature Granulocytes: 0.03 10*3/uL (ref 0.00–0.07)
Basophils Absolute: 0 10*3/uL (ref 0.0–0.1)
Basophils Relative: 0 %
Eosinophils Absolute: 0.2 10*3/uL (ref 0.0–0.5)
Eosinophils Relative: 2 %
HCT: 34.1 % — ABNORMAL LOW (ref 36.0–46.0)
Hemoglobin: 11.2 g/dL — ABNORMAL LOW (ref 12.0–15.0)
Immature Granulocytes: 0 %
Lymphocytes Relative: 23 %
Lymphs Abs: 1.8 10*3/uL (ref 0.7–4.0)
MCH: 30.6 pg (ref 26.0–34.0)
MCHC: 32.8 g/dL (ref 30.0–36.0)
MCV: 93.2 fL (ref 80.0–100.0)
Monocytes Absolute: 0.6 10*3/uL (ref 0.1–1.0)
Monocytes Relative: 8 %
Neutro Abs: 5.2 10*3/uL (ref 1.7–7.7)
Neutrophils Relative %: 67 %
Platelets: 169 10*3/uL (ref 150–400)
RBC: 3.66 MIL/uL — ABNORMAL LOW (ref 3.87–5.11)
RDW: 13.8 % (ref 11.5–15.5)
WBC: 7.8 10*3/uL (ref 4.0–10.5)
nRBC: 0 % (ref 0.0–0.2)

## 2021-06-27 MED ORDER — SODIUM CHLORIDE 0.9 % IV SOLN
2.0000 g | Freq: Three times a day (TID) | INTRAVENOUS | Status: DC
  Filled 2021-06-27 (×2): qty 10

## 2021-06-27 MED ORDER — SODIUM CHLORIDE 0.9 % IV SOLN
2.0000 g | Freq: Once | INTRAVENOUS | Status: AC
  Administered 2021-06-27: 2 g via INTRAVENOUS
  Filled 2021-06-27: qty 10

## 2021-06-27 MED ORDER — ENSURE ENLIVE PO LIQD
237.0000 mL | Freq: Two times a day (BID) | ORAL | Status: DC
  Administered 2021-06-28 (×2): 237 mL via ORAL

## 2021-06-27 MED ORDER — TACROLIMUS 1 MG PO CAPS
1.5000 mg | ORAL_CAPSULE | Freq: Two times a day (BID) | ORAL | Status: DC
  Administered 2021-06-27 – 2021-06-29 (×4): 1.5 mg via ORAL
  Filled 2021-06-27 (×4): qty 1

## 2021-06-27 MED ORDER — SODIUM CHLORIDE 0.9 % IV SOLN
2.0000 g | Freq: Three times a day (TID) | INTRAVENOUS | Status: DC
  Administered 2021-06-27 – 2021-06-29 (×5): 2 g via INTRAVENOUS
  Filled 2021-06-27 (×3): qty 10
  Filled 2021-06-27: qty 2
  Filled 2021-06-27 (×3): qty 10

## 2021-06-27 MED ORDER — ACETAMINOPHEN 650 MG RE SUPP: 650.0000 mg | Freq: Four times a day (QID) | RECTAL | Status: DC | PRN

## 2021-06-27 MED ORDER — HEPARIN SODIUM (PORCINE) 5000 UNIT/ML IJ SOLN: 5000.0000 [IU] | Freq: Three times a day (TID) | INTRAMUSCULAR | Status: DC

## 2021-06-27 MED ORDER — SODIUM CHLORIDE 0.9 % IV SOLN: 2.0000 g | Freq: Once | INTRAVENOUS | Status: DC

## 2021-06-27 MED ORDER — METOPROLOL TARTRATE 50 MG PO TABS
50.0000 mg | ORAL_TABLET | Freq: Two times a day (BID) | ORAL | Status: DC
  Administered 2021-06-27 – 2021-06-29 (×4): 50 mg via ORAL
  Filled 2021-06-27 (×4): qty 1

## 2021-06-27 MED ORDER — ACETAMINOPHEN 325 MG PO TABS
650.0000 mg | ORAL_TABLET | Freq: Four times a day (QID) | ORAL | Status: DC | PRN
Start: 2021-06-27 — End: 2021-06-29
  Administered 2021-06-28: 650 mg via ORAL
  Filled 2021-06-27: qty 2

## 2021-06-27 MED ORDER — MYCOPHENOLATE SODIUM 180 MG PO TBEC
180.0000 mg | DELAYED_RELEASE_TABLET | Freq: Two times a day (BID) | ORAL | Status: DC
  Administered 2021-06-27 – 2021-06-29 (×4): 180 mg via ORAL
  Filled 2021-06-27 (×5): qty 1

## 2021-06-27 MED ORDER — PREDNISONE 5 MG PO TABS
5.0000 mg | ORAL_TABLET | Freq: Every day | ORAL | Status: DC
  Administered 2021-06-28 – 2021-06-29 (×2): 5 mg via ORAL
  Filled 2021-06-27 (×2): qty 1

## 2021-06-27 NOTE — ED Provider Notes (Signed)
?[facility] DEPT ?Provider Note ? ?CSN: [id number] ?Arrival date & time: 06/27/21 4098 ? ?Chief Complaint(s) ?Flank Pain ? ?HPI ?Tasha Brown is a 56 y.o. female with PMH HTN, renal transplant in 2001 who presents emergency department for evaluation of flank pain and dysuria.  Patient states that she is immunosuppressed secondary to her transplant and that she has frequent UTIs, normally requiring extended courses of Bactrim.  She also states that she has multiple allergies to antibiotics.  She states that she just completed a course of 10 days of Bactrim and felt well for approximately 48 hours but now her symptoms have returned.  She denies chest pain, shortness of breath, abdominal pain, nausea, vomiting or other systemic symptoms.  Endorses left flank pain. ? ? ?Flank Pain ? ? ?Past Medical History ?Past Medical History:  ?Diagnosis Date  ? Cancer [facility])   ? Hypertension   ? ?There are no problems to display for this patient. ? ?Home Medication(s) ?Prior to Admission medications   ?Medication Sig Start Date End Date Taking? Authorizing Provider  ?calcitRIOL (ROCALTROL) 0.25 MCG capsule Take 0.25 mcg by mouth 3 (three) times a week. Mon, Wed, Saturday    [provider]  ?Cholecalciferol (VITAMIN D) 50 MCG (2000 UT) CAPS Take 2,000 Units by mouth daily.    [provider]  ?HYDROcodone-acetaminophen (NORCO) 5-325 MG tablet Take 1 tablet by mouth every 6 (six) hours as needed. 12/30/20   Jacqlyn Larsen, PA-C  ?methocarbamol (ROBAXIN) 500 MG tablet Take 1 tablet (500 mg total) by mouth 2 (two) times daily. 12/30/20   Jacqlyn Larsen, PA-C  ?metoprolol tartrate (LOPRESSOR) 100 MG tablet Take 100 mg by mouth 2 (two) times daily.    [provider]  ?mycophenolate (MYFORTIC) 180 MG EC tablet Take 180 mg by mouth 2 (two) times daily.    [provider]  ?Niacinamide-Zn-Cu-Methfo-Se-Cr (NICOTINAMIDE PO) Take 500 mg by mouth 2 (two) times daily.    [provider]  ?predniSONE (DELTASONE) 5 MG tablet Take 5 mg by mouth daily with breakfast.    [provider]  ?tacrolimus (PROGRAF) 0.5 MG capsule Take 1.5 mg by mouth 2 (two) times daily.    [provider]  ?                                                                                                                                  ?Past Surgical History ?Past Surgical History:  ?Procedure Laterality Date  ? ABDOMINAL HYSTERECTOMY    ? NEPHRECTOMY TRANSPLANTED ORGAN  2001  ? ?Family History ?History reviewed. No pertinent family history. ? ?Social History ?Social History  ? ?Tobacco Use  ? Smoking status: Never  ? Smokeless tobacco: Never  ?Substance Use Topics  ? Alcohol use: Never  ? Drug use: Never  ? ?Allergies ?Penicillins, Contrast media [iodinated contrast media], Decadron [dexamethasone], Tetracyclines & related, and Erythromycin ? ?Review of Systems ?  Review of Systems  ?Genitourinary:  Positive for dysuria, flank pain and urgency.  ? ?Physical Exam ?Vital Signs  ?I have reviewed the triage vital signs ?BP (!) 176/88   Pulse (!) 50   Temp 97.8 ?F (36.6 ?C) (Oral)   Resp 18   Ht 5' 3.5" (1.613 m)   Wt 115.7 kg   SpO2 95%   BMI 44.46 kg/m?  ? ?Physical Exam ?Vitals and nursing note reviewed.  ?Constitutional:   ?   General: She is not in acute distress. ?   Appearance: She is well-developed.  ?HENT:  ?   Head: Normocephalic and atraumatic.  ?Eyes:  ?   Conjunctiva/sclera: Conjunctivae normal.  ?Cardiovascular:  ?   Rate and Rhythm: Normal rate and regular rhythm.  ?   Heart sounds: No murmur heard. ?Pulmonary:  ?   Effort: Pulmonary effort is normal. No respiratory distress.  ?   Breath sounds: Normal breath sounds.  ?Abdominal:  ?   Palpations: Abdomen is soft.  ?   Tenderness: There is abdominal tenderness. There is left CVA tenderness.  ?Musculoskeletal:     ?   General: No swelling.  ?   Cervical back: Neck supple.  ?Skin: ?   General: Skin is warm and dry.  ?   Capillary  Refill: Capillary refill takes less than 2 seconds.  ?Neurological:  ?   Mental Status: She is alert.  ?Psychiatric:     ?   Mood and Affect: Mood normal.  ? ? ?ED Results and Treatments ?Labs ?(all labs ordered are listed, but only abnormal results are displayed) ?Labs Reviewed  ?COMPREHENSIVE METABOLIC PANEL - Abnormal; Notable for the following components:  ?    Result Value  ? Sodium 134 (*)   ? Glucose, Bld 101 (*)   ? BUN 29 (*)   ? Creatinine, Ser 1.50 (*)   ? GFR, Estimated 40 (*)   ? All other components within normal limits  ?CBC WITH DIFFERENTIAL/PLATELET - Abnormal; Notable for the following components:  ? RBC 3.66 (*)   ? Hemoglobin 11.2 (*)   ? HCT 34.1 (*)   ? All other components within normal limits  ?URINALYSIS, ROUTINE W REFLEX MICROSCOPIC - Abnormal; Notable for the following components:  ? Color, Urine STRAW (*)   ? Hgb urine dipstick SMALL (*)   ? Protein, ur 30 (*)   ? Leukocytes,Ua MODERATE (*)   ? All other components within normal limits  ?                                                                                                                       ? ?Radiology ?CT Renal Stone Study ? ?Result Date: 06/27/2021 ?CLINICAL DATA:  Acute left flank pain. EXAM: CT ABDOMEN AND PELVIS WITHOUT CONTRAST TECHNIQUE: Multidetector CT imaging of the abdomen and pelvis was performed following the standard protocol without IV contrast. RADIATION DOSE REDUCTION: This exam was performed according to the departmental dose-optimization program which includes automated  exposure control, adjustment of the mA and/or kV according to patient size and/or use of iterative reconstruction technique. COMPARISON:  None available currently. FINDINGS: Lower chest: No acute abnormality. Hepatobiliary: No focal liver abnormality is seen. Status post cholecystectomy. No biliary dilatation. Pancreas: Unremarkable. No pancreatic ductal dilatation or surrounding inflammatory changes. Spleen: Normal in size without focal  abnormality. Adrenals/Urinary Tract: Adrenal glands appear normal. Bilateral renal atrophy is noted consistent with history of end-stage renal disease. No hydronephrosis or renal obstruction is noted. Urinary bladder is unremarkable. Renal transplant is noted in left lower quadrant without evidence of hydronephrosis. Stomach/Bowel: Stomach is within normal limits. Appendix appears normal. No evidence of bowel wall thickening, distention, or inflammatory changes. Vascular/Lymphatic: No significant vascular findings are present. No enlarged abdominal or pelvic lymph nodes. Reproductive: Status post hysterectomy. No adnexal masses. Other: No abdominal wall hernia or abnormality. No abdominopelvic ascites. Musculoskeletal: No acute or significant osseous findings. IMPRESSION: Bilateral renal atrophy consistent with history of end-stage renal disease. Renal transplant is noted in left lower quadrant without evidence of hydronephrosis. No definite acute abnormality seen in the abdomen or pelvis. Electronically Signed   By: Marijo Conception M.D.   On: 06/27/2021 12:34   ? ?Pertinent labs & imaging results that were available during my care of the patient were reviewed by me and considered in my medical decision making (see MDM for details). ? ?Medications Ordered in ED ?Medications - No data to display                                                               ?                                                                    ?Procedures ?Procedures ? ?(including critical care time) ? ?Medical Decision Making / ED Course ? ? ?This patient presents to the ED for concern of dysuria, flank pain, this involves an extensive number of treatment options, and is a complaint that carries with it a high risk of complications and morbidity.  The differential diagnosis includes cystitis, pyelonephritis, nephrolithiasis ? ?MDM: ?Patient seen in the emergency department for evaluation of flank pain and dysuria.  Physical exam with  mild left flank tenderness and suprapubic tenderness but is otherwise unremarkable.  Laboratory evaluation with a hemoglobin of 11.2, creatinine 1.5 which is the patient's baseline.  Urinalysis with moderate l

## 2021-06-27 NOTE — ED Provider Triage Note (Signed)
Emergency Medicine Provider Triage Evaluation Note ? ?Tasha Brown , a 56 y.o. female  was evaluated in triage.  Pt complains of left flank pain, suprapubic tenderness and increased urinary frequency.  Symptoms consistent with UTI.  No fever or vomiting but patient has been very nauseous.  Recently completed course of Bactrim 2 days ago and was improving, but worsened after stopping the antibiotic. ? ?Review of Systems  ?Positive: Increased urinary frequency, dysuria ?Negative: Vomiting ? ?Physical Exam  ?BP (!) 173/99 (BP Location: Left Arm)   Pulse (!) 51   Temp 97.8 ?F (36.6 ?C) (Oral)   Resp 18   Ht 5' 3.5" (1.613 m)   Wt 115.7 kg   SpO2 99%   BMI 44.46 kg/m?  ?Gen:   Awake, no distress   ?Resp:  Normal effort  ?MSK:   Moves extremities without difficulty  ?Other:  Mild suprapubic tenderness ? ?Medical Decision Making  ?Medically screening exam initiated at 10:05 AM.  Appropriate orders placed.  Tasha Brown was informed that the remainder of the evaluation will be completed by another provider, this initial triage assessment does not replace that evaluation, and the importance of remaining in the ED until their evaluation is complete. ? ? ?  ?Carlisle Cater, PA-C ?06/27/21 1006 ? ?

## 2021-06-27 NOTE — ED Notes (Signed)
Pt ambulatory to restroom without assistance 

## 2021-06-27 NOTE — ED Triage Notes (Signed)
Pt presents with c/o left side flank pain. Pt reports that she just recently finished a course of antibiotics for a UTI. Pt reports that she took an AZO test yesterday and it showed positive for leukocytes. Pt denies hematuria. ?

## 2021-06-27 NOTE — H&P (Signed)
?History and Physical  ? ? ?Patient: Tasha Brown JEH:[id number] DOB: [birth year] ?DOA: 06/27/2021 ?DOS: the patient was seen and examined on 06/27/2021 ?PCP: Pcp, No  ?Patient coming from: Home ? ?Chief Complaint:  ?Chief Complaint  ?Patient presents with  ? Flank Pain  ? ?HPI: Tasha Brown is a 56 y.o. female with medical history significant of renal transplant in 2001 from [facility],  presented initially to OSH 13 days prior to this visit for UTI. Pt was sent home with course of bactrim, completing total 10 days. One day prior to admit, pt reported increased abd discomfort, L flank pain, suggestive of prior UTI's. Pt presented to ED where UA was found to be suggestive of UTI. Given extensive allergy hx, pt was started on aztreonam. Hospialist consulted for consideration for admission ? ?Review of Systems: As mentioned in the history of present illness. All other systems reviewed and are negative. ?Past Medical History:  ?Diagnosis Date  ? Cancer [facility])   ? Hypertension   ? ?Past Surgical History:  ?Procedure Laterality Date  ? ABDOMINAL HYSTERECTOMY    ? NEPHRECTOMY TRANSPLANTED ORGAN  2001  ? ?Social History:  reports that she has never smoked. She has never used smokeless tobacco. She reports that she does not drink alcohol and does not use drugs. ? ?Allergies  ?Allergen Reactions  ? Cephalosporins Nausea And Vomiting and Other (See Comments)  ?  "I threw up non-stop"  ? Iodinated Contrast Media Shortness Of Breath, Swelling and Other (See Comments)  ?  Wheezing and Pharyngeal swelling  ? Penicillins Anaphylaxis  ? Simvastatin Shortness Of Breath  ? Tetracyclines & Related Other (See Comments)  ?  Kidney MD said to NEVER take this  ? Bactroban [Mupirocin] Other (See Comments)  ?  Erythema  ? Cefepime Nausea And Vomiting  ? Crestor [Rosuvastatin] Other (See Comments)  ?  "Heavy block on my chest and made me wheeze"  ? Dexamethasone Other (See Comments)  ?  Patient went "unresponsive"- syncope  ? Influenza  Vaccines Other (See Comments)  ?  "Fainting and numbness"  ? Latex Itching, Swelling and Other (See Comments)  ?  Skin swells and itches  ? Losartan Other (See Comments)  ?  Dizziness  ? Metronidazole Diarrhea  ? Morphine Nausea And Vomiting  ? Mycophenolate Mofetil Diarrhea  ? Omeprazole Other (See Comments)  ?  Headache  ? Pneumococcal Vaccine Other (See Comments)  ?  "Part of my body went numb and I could not move"  ? Polyethylene Glycol Other (See Comments)  ?  Dizziness  ? Pravastatin Other (See Comments)  ?  Reaction not recalled  ? Red Dye Itching  ? Shellfish-Derived Products Nausea And Vomiting and Other (See Comments)  ?  Mussels  ? Soybean Oil Diarrhea  ? Tetracycline Itching  ? Turkey-Sweet Potatoes-Peaches [Alimentum] Itching and Nausea Only  ?  [place]-   ? Yellow Dyes (Non-Tartrazine) Itching  ? Zantac [Ranitidine] Other (See Comments)  ?  Reaction not cited  ? Erythromycin Hives and Rash  ? Gadolinium Rash  ? Neomycin-Polymyxin B Gu Rash  ? Tape Rash  ? ? ?Family hx ?Multiple members with diabetes insipidus  ? ?Prior to Admission medications   ?Medication Sig Start Date End Date Taking? Authorizing Provider  ?acetaminophen (TYLENOL) 325 MG tablet Take 325-650 mg by mouth every 6 (six) hours as needed for mild pain or headache.   Yes [provider]  ?Black Currant Seed Oil 500 MG CAPS Take 2,000 mg by  mouth daily with lunch.   Yes [provider]  ?Chlorophyll (CHLOROXYGEN PO) Take 1 capsule by mouth daily with lunch.   Yes [provider]  ?Cholecalciferol (VITAMIN D3) 25 MCG (1000 UT) CAPS Take 1,000 Units by mouth daily with lunch.   Yes [provider]  ?metoprolol tartrate (LOPRESSOR) 100 MG tablet Take 50 mg by mouth in the morning and at bedtime.   Yes [provider]  ?mycophenolate (MYFORTIC) 180 MG EC tablet Take 180 mg by mouth in the morning and at bedtime.   Yes [provider]  ?Niacinamide-Zn-Cu-Methfo-Se-Cr (NICOTINAMIDE PO) Take  500 mg by mouth daily with lunch.   Yes [provider]  ?predniSONE (DELTASONE) 5 MG tablet Take 5 mg by mouth daily with breakfast.   Yes [provider]  ?tacrolimus (PROGRAF) 0.5 MG capsule Take 1.5 mg by mouth in the morning and at bedtime.   Yes [provider]  ?HYDROcodone-acetaminophen (NORCO) 5-325 MG tablet Take 1 tablet by mouth every 6 (six) hours as needed. ?Patient not taking: Reported on 06/27/2021 12/30/20   Jacqlyn Larsen, PA-C  ?methocarbamol (ROBAXIN) 500 MG tablet Take 1 tablet (500 mg total) by mouth 2 (two) times daily. ?Patient not taking: Reported on 06/27/2021 12/30/20   Jacqlyn Larsen, PA-C  ? ? ?Physical Exam: ?Vitals:  ? 06/27/21 0927 06/27/21 1130 06/27/21 1330 06/27/21 1615  ?BP: (!) 173/99 (!) 176/88 (!) 155/66   ?Pulse: (!) 51 (!) 50 (!) 52 66  ?Resp: '18 18 18   '$ ?Temp: 97.8 ?F (36.6 ?C)     ?TempSrc: Oral     ?SpO2: 99% 95% 96% 99%  ?Weight: 115.7 kg     ?Height: 5' 3.5" (1.613 m)     ? ?General exam: Awake, laying in bed, in nad ?Respiratory system: Normal respiratory effort, no wheezing ?Cardiovascular system: regular rate, s1, s2 ?Gastrointestinal system: Soft, nondistended, positive BS ?Central nervous system: CN2-12 grossly intact, strength intact ?Extremities: Perfused, no clubbing ?Skin: Normal skin turgor, no notable skin lesions seen ?Psychiatry: Mood normal // no visual hallucinations  ? ?Data Reviewed: ? ?Labs reviewed: Na 134, Cr 1.5 ? ?Assessment and Plan: ?No notes have been filed under this hospital service. ?Service: Hospitalist ? ?UTI ?-failed outpt abx ?-F/u on urine culture ?-cont on aztreonam given multiple allergy hx ?-repeat cbc and cmp in AM ? ?Hx renal transplant ?-Cr currently 1.5, appears stable ?-Cont home anti-rejection meds as tolerated ? ?Hyponatremia ?-Na 134 ?-cont to encourage PO intake ?-recheck bmet in AM ? ?Anemia ?-Hgb 11.2 on presentation ?-Hemodynamically stable ?-Recheck CBC in AM ? ? Advance Care Planning:   Code  Status: Full Code full ? ?Consults:  ? ?Family Communication: Pt in room, family at bedside ? ?Severity of Illness: ?The appropriate patient status for this patient is INPATIENT. Inpatient status is judged to be reasonable and necessary in order to provide the required intensity of service to ensure the patient's safety. The patient's presenting symptoms, physical exam findings, and initial radiographic and laboratory data in the context of their chronic comorbidities is felt to place them at high risk for further clinical deterioration. Furthermore, it is not anticipated that the patient will be medically stable for discharge from the hospital within 2 midnights of admission.  ? ?* I certify that at the point of admission it is my clinical judgment that the patient will require inpatient hospital care spanning beyond 2 midnights from the point of admission due to high intensity of service, high risk  for further deterioration and high frequency of surveillance required.* ? ?Author: ?Marylu Lund, MD ?06/27/2021 4:29 PM ? ?For on call review [email or web address].  ?

## 2021-06-27 NOTE — Progress Notes (Signed)
Pharmacy Antibiotic Note ? ?Tasha Brown is a 56 y.o. female admitted on 06/27/2021 with flank pain. Patient recently completed a course of Bactrim for UTI. Pharmacy has been consulted for aztreonam dosing due to extended allergy list. Patient with reported anaphylaxis to penicillins but with N/V to cephalosporins. No h/o cephalosporin use here to verify. ? ?Plan: ?Aztreonam 2 g IV q8h ? ?Height: 5' 3.5" (161.3 cm) ?Weight: 115.7 kg (255 lb) ?IBW/kg (Calculated) : 53.55 ? ?Temp (24hrs), Avg:98 ?F (36.7 ?C), Min:97.8 ?F (36.6 ?C), Max:98.1 ?F (36.7 ?C) ? ?Recent Labs  ?Lab 06/27/21 ?1112  ?WBC 7.8  ?CREATININE 1.50*  ?  ?Estimated Creatinine Clearance: 50.6 mL/min (A) (by C-G formula based on SCr of 1.5 mg/dL (H)).   ? ?Allergies  ?Allergen Reactions  ? Cephalosporins Nausea And Vomiting and Other (See Comments)  ?  "I threw up non-stop"  ? Iodinated Contrast Media Shortness Of Breath, Swelling and Other (See Comments)  ?  Wheezing and Pharyngeal swelling  ? Penicillins Anaphylaxis  ? Simvastatin Shortness Of Breath  ? Tetracyclines & Related Other (See Comments)  ?  Kidney MD said to NEVER take this  ? Bactroban [Mupirocin] Other (See Comments)  ?  Erythema  ? Cefepime Nausea And Vomiting  ? Crestor [Rosuvastatin] Other (See Comments)  ?  "Heavy block on my chest and made me wheeze"  ? Dexamethasone Other (See Comments)  ?  Patient went "unresponsive"- syncope  ? Influenza Vaccines Other (See Comments)  ?  "Fainting and numbness"  ? Latex Itching, Swelling and Other (See Comments)  ?  Skin swells and itches  ? Losartan Other (See Comments)  ?  Dizziness  ? Metronidazole Diarrhea  ? Morphine Nausea And Vomiting  ? Mycophenolate Mofetil Diarrhea  ? Omeprazole Other (See Comments)  ?  Headache  ? Pneumococcal Vaccine Other (See Comments)  ?  "Part of my body went numb and I could not move"  ? Polyethylene Glycol Other (See Comments)  ?  Dizziness  ? Pravastatin Other (See Comments)  ?  Reaction not recalled  ? Red Dye  Itching  ? Shellfish-Derived Products Nausea And Vomiting and Other (See Comments)  ?  Mussels  ? Soybean Oil Diarrhea  ? Tetracycline Itching  ? Turkey-Sweet Potatoes-Peaches [Alimentum] Itching and Nausea Only  ?  [place]-   ? Yellow Dyes (Non-Tartrazine) Itching  ? Zantac [Ranitidine] Other (See Comments)  ?  Reaction not cited  ? Erythromycin Hives and Rash  ? Gadolinium Rash  ? Neomycin-Polymyxin B Gu Rash  ? Tape Rash  ? ? ?Antimicrobials this admission: ?Aztreonam 4/10 >> ? ?Dose adjustments this admission: NA ? ?Microbiology results: ?4/10 BCx: pending ?4/10 UCx: pending  ? ? ?Thank you for allowing pharmacy to be a part of this patient?s care. ? ?Tawnya Crook, PharmD, BCPS ?Clinical Pharmacist ?06/27/2021 6:26 PM ? ? ?

## 2021-06-28 LAB — CBC
HCT: 29.7 % — ABNORMAL LOW (ref 36.0–46.0)
Hemoglobin: 9.5 g/dL — ABNORMAL LOW (ref 12.0–15.0)
MCH: 30.3 pg (ref 26.0–34.0)
MCHC: 32 g/dL (ref 30.0–36.0)
MCV: 94.6 fL (ref 80.0–100.0)
Platelets: 137 10*3/uL — ABNORMAL LOW (ref 150–400)
RBC: 3.14 MIL/uL — ABNORMAL LOW (ref 3.87–5.11)
RDW: 13.9 % (ref 11.5–15.5)
WBC: 6.7 10*3/uL (ref 4.0–10.5)
nRBC: 0 % (ref 0.0–0.2)

## 2021-06-28 LAB — COMPREHENSIVE METABOLIC PANEL
ALT: 11 U/L (ref 0–44)
AST: 12 U/L — ABNORMAL LOW (ref 15–41)
Albumin: 3.2 g/dL — ABNORMAL LOW (ref 3.5–5.0)
Alkaline Phosphatase: 40 U/L (ref 38–126)
Anion gap: 6 (ref 5–15)
BUN: 29 mg/dL — ABNORMAL HIGH (ref 6–20)
CO2: 24 mmol/L (ref 22–32)
Calcium: 8.9 mg/dL (ref 8.9–10.3)
Chloride: 105 mmol/L (ref 98–111)
Creatinine, Ser: 1.48 mg/dL — ABNORMAL HIGH (ref 0.44–1.00)
GFR, Estimated: 41 mL/min — ABNORMAL LOW (ref >60)
Glucose, Bld: 117 mg/dL — ABNORMAL HIGH (ref 70–99)
Potassium: 4.4 mmol/L (ref 3.5–5.1)
Sodium: 135 mmol/L (ref 135–145)
Total Bilirubin: 0.6 mg/dL (ref 0.3–1.2)
Total Protein: 5.8 g/dL — ABNORMAL LOW (ref 6.5–8.1)

## 2021-06-28 LAB — HIV ANTIBODY (ROUTINE TESTING W REFLEX): HIV Screen 4th Generation wRfx: NONREACTIVE

## 2021-06-28 NOTE — Progress Notes (Signed)
?  Progress Note ? ? ?Patient: Tasha Brown QMG:[id number] DOB: [birth year] DOA: 06/27/2021     1 ?DOS: the patient was seen and examined on 06/28/2021 ?  ?Brief hospital course: ? 56 y.o. female with medical history significant of renal transplant in 2001 from [facility],  presented initially to OSH 13 days prior to this visit for UTI. Pt was sent home with course of bactrim, completing total 10 days. One day prior to admit, pt reported increased abd discomfort, L flank pain, suggestive of prior UTI's. Pt presented to ED where UA was found to be suggestive of UTI. Given extensive allergy hx, pt was started on aztreonam. Hospialist consulted for consideration for admission ? ?Assessment and Plan: ?No notes have been filed under this hospital service. ?Service: Hospitalist ? ?UTI ?-failed outpt abx ?-Urine culture pending. Blood cx neg thus far ?-cont on aztreonam given multiple allergy hx ?-Pt reports feeling better this AM ?-afebrile. No leukocytosis ?-repeat cbc and cmp in AM ?  ?Hx renal transplant ?-Cr currently 148, appears stable ?-Cont home anti-rejection meds as tolerated ?  ?Hyponatremia ?-Na 135 ?-cont to encourage PO intake ?-recheck bmet in AM ?  ?Anemia ?-Hgb 11.2 on presentation ?-Hemodynamically stable ?-No evidence of acute blood loss ?-Recheck CBC in AM ?  ?  ? ?Subjective: Complains of mild headache this AM ? ?Physical Exam: ?Vitals:  ? 06/28/21 0141 06/28/21 0620 06/28/21 1045 06/28/21 1408  ?BP: 131/69 (!) 156/97 (!) 150/81 136/79  ?Pulse: (!) 56 (!) 58 (!) 55 (!) 55  ?Resp: '17 17 18 18  '$ ?Temp: 98.4 ?F (36.9 ?C) 98 ?F (36.7 ?C) 97.8 ?F (36.6 ?C) 98 ?F (36.7 ?C)  ?TempSrc: Oral Oral Oral Oral  ?SpO2: 98% 98% 99% 97%  ?Weight:      ?Height:      ? ?General exam: Awake, laying in bed, in nad ?Respiratory system: Normal respiratory effort, no wheezing ?Cardiovascular system: regular rate, s1, s2 ?Gastrointestinal system: Soft, nondistended, positive BS ?Central nervous system: CN2-12 grossly intact,  strength intact ?Extremities: Perfused, no clubbing ?Skin: Normal skin turgor, no notable skin lesions seen ?Psychiatry: Mood normal // no visual hallucinations  ? ?Data Reviewed: ? ?Labs reviewed: Cr 1.48, Hgb 9.5 ? ?Family Communication: Pt in room, family not at bedside ? ?Disposition: ?Status is: Inpatient ?Remains inpatient appropriate because: Severity of illness ? Planned Discharge Destination: Home ? ? ? ? ?Author: ?Marylu Lund, MD ?06/28/2021 5:03 PM ? ?For on call review [email or web address]  ?

## 2021-06-28 NOTE — Progress Notes (Signed)
Initial Nutrition Assessment ? ?DOCUMENTATION CODES:  ? ?Morbid obesity ? ?INTERVENTION:  ?- complete NFPE when feasible.  ? ? ?NUTRITION DIAGNOSIS:  ? ?Increased nutrient needs related to acute illness as evidenced by estimated needs. ? ?GOAL:  ? ?Patient will meet greater than or equal to 90% of their needs ? ?MONITOR:  ? ?PO intake, Weight trends, Labs ? ?REASON FOR ASSESSMENT:  ? ?Malnutrition Screening Tool ? ?ASSESSMENT:  ? ?56 y.o. female with medical history of renal transplant in 2001 at [facility], HTN, and cancer. she presented to to the ED due to increased abdominal discomfort, L flank pain, and concern for recurrent UTI; UA in the ED suggestive of UTI. ? ?Unable to see patient at time of attempted visit earlier this afternoon. Review of flow sheet indicates patient ate 0% of dinner last night; no other meal completion percentages documented since admission.  ? ?She has not been seen by a [place] RD at any time in the past. ? ?MST report note indicates patient reported 11 lb weight loss in the past 6 weeks and that her appetite has been fair. ? ?Weight yesterday was documented as 255 lb and PTA the only other weight recorded was 265 lb on 12/30/20. This indicates 3.8% lb weight loss which would not be significant for 6 week time frame. ? ?Patient with multiple food and medication allergies. RD does not feel comfortable ordering oral nutrition supplements or snacks for patient without discussing with patient first for this reason.  ? ? ?Labs reviewed; BUN: 29 mg/dl, creatinine: 1.48 mg/dl, GFR: 41 ml/min.  ?Medications reviewed; 5 mg deltasone/day, 1.5 mg prograf BID. ?  ? ?NUTRITION - FOCUSED PHYSICAL EXAM: ? ?Unable at this time. ? ?Diet Order:   ?Diet Order   ? ?       ?  Diet regular Room service appropriate? Yes; Fluid consistency: Thin  Diet effective now       ?  ? ?  ?  ? ?  ? ? ?EDUCATION NEEDS:  ? ?No education needs have been identified at this time ? ?Skin:  Skin Assessment: Reviewed RN  Assessment ? ?Last BM:  PTA/unknown ? ?Height:  ? ?Ht Readings from Last 1 Encounters:  ?06/27/21 5' 3.5" (1.613 m)  ? ? ?Weight:  ? ?Wt Readings from Last 1 Encounters:  ?06/27/21 115.7 kg  ? ? ? ?BMI:  Body mass index is 44.46 kg/m?. ? ?Estimated Nutritional Needs:  ?Kcal:  2000-2250 kcal ?Protein:  100-115 grams ?Fluid:  >/= 2.3 L/day ? ? ? ? ?Jarome Matin, MS, RD, LDN ?Registered Dietitian II ?Inpatient Clinical Nutrition ?RD pager # and on-call/weekend pager # available in [place]  ? ?

## 2021-06-28 NOTE — TOC Initial Note (Signed)
Transition of Care (TOC) - Initial/Assessment Note  ? ?Patient Details  ?Name: Tasha Brown ?MRN: [id number] ?Date of Birth: 02/13/1966 ? ?Transition of Care (TOC) CM/SW Contact:    ?Sherie Don, LCSW ?Phone Number: ?06/28/2021, 9:45 AM ? ?Clinical Narrative: Per chart review, patient does not appear to have insurance or a PCP. CSW met with patient to confirm if that is the case. Per patient, she recently moved to [facility] and is scheduled to see a new PCP in May and she has [employer] and Medicare as secondary. ? ?Expected Discharge Plan: Home/Self Care ?Barriers to Discharge: Continued Medical Work up ? ?Patient Goals and CMS Choice ?Patient states their goals for this hospitalization and ongoing recovery are:: Return home ?Choice offered to / list presented to : NA ? ?Expected Discharge Plan and Services ?Expected Discharge Plan: Home/Self Care ?In-house Referral: Clinical Social Work ?Post Acute Care Choice: NA ?Living arrangements for the past 2 months: [place]             ?DME Arranged: N/A ?DME Agency: NA ? ?Prior Living Arrangements/Services ?Living arrangements for the past 2 months: [place] ?Lives with:: Spouse ?Patient language and need for interpreter reviewed:: Yes ?Do you feel safe going back to the place where you live?: Yes      ?Need for Family Participation in Patient Care: No (Comment) ?Care giver support system in place?: Yes (comment) ?Criminal Activity/Legal Involvement Pertinent to Current Situation/Hospitalization: No - Comment as needed ? ?Activities of Daily Living ?Home Assistive Devices/Equipment: Eyeglasses ?ADL Screening (condition at time of admission) ?Patient's cognitive ability adequate to safely complete daily activities?: Yes ?Is the patient deaf or have difficulty hearing?: No ?Does the patient have difficulty seeing, even when wearing glasses/contacts?: Yes ?Does the patient have difficulty concentrating, remembering, or making decisions?: No ?Patient  able to express need for assistance with ADLs?: Yes ?Does the patient have difficulty dressing or bathing?: No ?Independently performs ADLs?: Yes (appropriate for developmental age) ?Does the patient have difficulty walking or climbing stairs?: Yes (walks slowly) ?Weakness of Legs: Both ?Weakness of Arms/Hands: Both ? ?Emotional Assessment ?Appearance:: Appears stated age ?Attitude/Demeanor/Rapport: Engaged ?Affect (typically observed): Accepting ?Orientation: : Oriented to Self, Oriented to Place, Oriented to  Time, Oriented to Situation ?Alcohol / Substance Use: Not Applicable ?Psych Involvement: No (comment) ? ?Admission diagnosis:  Pyelonephritis [N12] ?Acute cystitis without hematuria [N30.00] ?Patient Active Problem List  ? Diagnosis Date Noted  ? Pyelonephritis 06/27/2021  ? History of renal transplant 06/27/2021  ? ?PCP:  Pcp, No ?Pharmacy:   ?[id number] [address] ?[id number]?Phone: [phone]: [phone]? ?Readmission Risk Interventions ?   ? View : No data to display.  ?  ?  ?  ? ?

## 2021-06-29 DIAGNOSIS — N1832 Chronic kidney disease, stage 3b: Secondary | ICD-10-CM

## 2021-06-29 DIAGNOSIS — E871 Hypo-osmolality and hyponatremia: Secondary | ICD-10-CM

## 2021-06-29 DIAGNOSIS — D638 Anemia in other chronic diseases classified elsewhere: Secondary | ICD-10-CM

## 2021-06-29 DIAGNOSIS — E66813 Obesity, class 3: Secondary | ICD-10-CM

## 2021-06-29 LAB — COMPREHENSIVE METABOLIC PANEL
ALT: 11 U/L (ref 0–44)
AST: 12 U/L — ABNORMAL LOW (ref 15–41)
Albumin: 3.1 g/dL — ABNORMAL LOW (ref 3.5–5.0)
Alkaline Phosphatase: 39 U/L (ref 38–126)
Anion gap: 6 (ref 5–15)
BUN: 35 mg/dL — ABNORMAL HIGH (ref 6–20)
CO2: 25 mmol/L (ref 22–32)
Calcium: 8.8 mg/dL — ABNORMAL LOW (ref 8.9–10.3)
Chloride: 107 mmol/L (ref 98–111)
Creatinine, Ser: 1.43 mg/dL — ABNORMAL HIGH (ref 0.44–1.00)
GFR, Estimated: 43 mL/min — ABNORMAL LOW (ref >60)
Glucose, Bld: 115 mg/dL — ABNORMAL HIGH (ref 70–99)
Potassium: 4.3 mmol/L (ref 3.5–5.1)
Sodium: 138 mmol/L (ref 135–145)
Total Bilirubin: <0.1 mg/dL — ABNORMAL LOW (ref 0.3–1.2)
Total Protein: 6.1 g/dL — ABNORMAL LOW (ref 6.5–8.1)

## 2021-06-29 LAB — CBC
HCT: 29.5 % — ABNORMAL LOW (ref 36.0–46.0)
Hemoglobin: 9.6 g/dL — ABNORMAL LOW (ref 12.0–15.0)
MCH: 30.6 pg (ref 26.0–34.0)
MCHC: 32.5 g/dL (ref 30.0–36.0)
MCV: 93.9 fL (ref 80.0–100.0)
Platelets: 138 10*3/uL — ABNORMAL LOW (ref 150–400)
RBC: 3.14 MIL/uL — ABNORMAL LOW (ref 3.87–5.11)
RDW: 13.9 % (ref 11.5–15.5)
WBC: 6.3 10*3/uL (ref 4.0–10.5)
nRBC: 0 % (ref 0.0–0.2)

## 2021-06-29 LAB — URINE CULTURE

## 2021-06-29 MED ORDER — CIPROFLOXACIN HCL 500 MG PO TABS: 500.0000 mg | ORAL_TABLET | Freq: Two times a day (BID) | ORAL | 0 refills | Status: AC

## 2021-06-29 MED ORDER — CIPROFLOXACIN HCL 500 MG PO TABS: 500.0000 mg | ORAL_TABLET | Freq: Two times a day (BID) | ORAL | Status: DC

## 2021-06-29 MED ORDER — SENNOSIDES-DOCUSATE SODIUM 8.6-50 MG PO TABS
1.0000 | ORAL_TABLET | Freq: Two times a day (BID) | ORAL | Status: DC | PRN
  Administered 2021-06-29: 1 via ORAL
  Filled 2021-06-29: qty 1

## 2021-06-29 MED ORDER — SENNOSIDES-DOCUSATE SODIUM 8.6-50 MG PO TABS: 1.0000 | ORAL_TABLET | Freq: Two times a day (BID) | ORAL | Status: AC | PRN

## 2021-06-29 NOTE — Hospital Course (Addendum)
56 year old F with PMH of renal transplant in 2001 at [facility], [place], recent UTI for which she was treated with Bactrim outpatient presenting with increased abdominal discomfort and frequent urination and admitted for possible urinary tract infection.  She was hemodynamically stable.  She has no fever or leukocytosis.  UA with moderate LE, small Hgb but no nitrites or bacteria.  CT renal stone study and US renal transplant with Doppler with without acute or significant finding.   Patient was started on IV aztreonam due to extensive allergies.   ? ?Patient remained afebrile without leukocytosis.  Symptoms resolved.  Blood cultures NGTD.  Clean-catch urine culture with multiple species.  She is discharged on p.o. ciprofloxacin 500 mg twice daily for 4 more days for possible UTI.  Patient reports tolerating Cipro in the past.  Advised to follow-up with PCP and his transplant team in 1 to 2 weeks or sooner if needed. ?  ?

## 2021-06-29 NOTE — Assessment & Plan Note (Signed)
Recent Labs  ?  12/30/20 ?1042 06/27/21 ?1112 06/28/21 ?0947 06/29/21 ?0339  ?BUN 23* 29* 29* 35*  ?CREATININE 1.50* 1.50* 1.48* 1.43*  ?Stable.  Recheck at follow-up. ? ?

## 2021-06-29 NOTE — Progress Notes (Signed)
Pt was discharged home today. Instructions were reviewed with patient, and questions were answered. Pt was taken to main entrance via wheelchair by NT.  

## 2021-06-29 NOTE — Discharge Summary (Signed)
? ?Physician Discharge Summary  ?Tasha Brown QQP:[id number] DOB: 12-25-1965 DOA: 06/27/2021 ? ?PCP: Pcp, No ? ?Admit date: 06/27/2021 ?Discharge date: 06/29/2021 ?Admitted From: Home ?Disposition: Home ?Recommendations for Outpatient Follow-up:  ?Follow ups as below. ?Please obtain CBC/BMP/Mag at follow up ?Please follow up on the following pending results: None ? ?Home Health: Not indicated ?Equipment/Devices: Not indicated ? ?Discharge Condition: Stable ?CODE STATUS: Full code ? ? ?Hospital course ?56 year old F with PMH of renal transplant in 2001 at [facility], [place], recent UTI for which she was treated with Bactrim outpatient presenting with increased abdominal discomfort and frequent urination and admitted for possible urinary tract infection.  She was hemodynamically stable.  She has no fever or leukocytosis.  UA with moderate LE, small Hgb but no nitrites or bacteria.  CT renal stone study and US renal transplant with Doppler with without acute or significant finding.   Patient was started on IV aztreonam due to extensive allergies.   ? ?Patient remained afebrile without leukocytosis.  Symptoms resolved.  Blood cultures NGTD.  Clean-catch urine culture with multiple species.  She is discharged on p.o. ciprofloxacin 500 mg twice daily for 4 more days for possible UTI.  Patient reports tolerating Cipro in the past.  Advised to follow-up with PCP and his transplant team in 1 to 2 weeks or sooner if needed. ?   ? ?See individual problem list below for more on hospital course. ? ?Problems addressed during this hospitalization ?Problem  ?Obesity, Class III, Bmi 40-49.9 (Morbid Obesity) (H[facility])  ?Chronic Kidney Disease, Stage 3b (Hcc)  ?Hyponatremia  ?Anemia of Chronic Disease  ?Possible UTI in patient with renal transplant  ?History of Renal Transplant  ?  ?Assessment and Plan: ?* Possible UTI in patient with renal transplant ?Received aztreonam for 2 days and discharged on p.o. Cipro for 4 more days. ?Pyelonephritis  ruled out. ? ?Anemia of chronic disease ?H&H stable. ? ?Hyponatremia ?Likely from CKD and Bactrim.  Resolved. ? ?Chronic kidney disease, stage 3b ([place]) ?Recent Labs  ?  12/30/20 ?1042 06/27/21 ?1112 06/28/21 ?7124 06/29/21 ?0339  ?BUN 23* 29* 29* 35*  ?CREATININE 1.50* 1.50* 1.48* 1.43*  ?Stable.  Recheck at follow-up. ? ? ?Obesity, Class III, BMI 40-49.9 (morbid obesity) ([place]) ?Body mass index is 44.46 kg/m?. ?Encourage lifestyle change to lose weight. ? ?History of renal transplant ?-Continue home immunosuppressants ? ? ? ?Vital signs ?Vitals:  ? 06/28/21 1408 06/28/21 2123 06/29/21 0634  ?BP: 136/79 133/62 (!) 141/80  ?Pulse: (!) 55 62 (!) 59  ?Temp: 98 ?F (36.7 ?C) 97.7 ?F (36.5 ?C) 97.8 ?F (36.6 ?C)  ?Resp: '18 17 17  '$ ?Height:     ?Weight:     ?SpO2: 97% 96% 97%  ?TempSrc: Oral Oral Oral  ?BMI (Calculated):     ?  ? ?Discharge exam ? ?GENERAL: No apparent distress.  Nontoxic. ?HEENT: MMM.  Vision and hearing grossly intact.  ?NECK: Supple.  No apparent JVD.  ?RESP:  No IWOB.  Fair aeration bilaterally. ?CVS:  RRR. Heart sounds normal.  ?ABD/GI/GU: BS+. Abd soft, NTND.  ?MSK/EXT:  Moves extremities. No apparent deformity. No edema.  ?SKIN: no apparent skin lesion or wound ?NEURO: Awake and alert. Oriented appropriately.  No apparent focal neuro deficit. ?PSYCH: Calm. Normal affect.  ? ?Discharge Instructions ?Discharge Instructions   ? ? Call MD for:  extreme fatigue   Complete by: As directed ?  ? Call MD for:  persistant nausea and vomiting   Complete by: As directed ?  ? Call  MD for:  severe uncontrolled pain   Complete by: As directed ?  ? Call MD for:  temperature >100.4   Complete by: As directed ?  ? Diet - low sodium heart healthy   Complete by: As directed ?  ? Discharge instructions   Complete by: As directed ?  ? It has been a pleasure taking care of you! ? ?You were hospitalized due to possible urinary tract infection for which you have been started on IV antibiotics.  We are discharging you on oral  antibiotics to complete treatment course.  Follow-up with your primary care doctor and nephrologist or transplant doctors in 1 to 2 weeks or sooner if needed. ? ? ?Take care,  ? Increase activity slowly   Complete by: As directed ?  ? ?  ? ?Allergies as of 06/29/2021   ? ?   Reactions  ? Cephalosporins Nausea And Vomiting, Other (See Comments)  ? "I threw up non-stop"  ? Iodinated Contrast Media Shortness Of Breath, Swelling, Other (See Comments)  ? Wheezing and Pharyngeal swelling  ? Penicillins Anaphylaxis  ? Simvastatin Shortness Of Breath  ? Tetracyclines & Related Other (See Comments)  ? Kidney MD said to NEVER take this  ? Bactroban [mupirocin] Other (See Comments)  ? Erythema  ? Cefepime Nausea And Vomiting  ? Crestor [rosuvastatin] Other (See Comments)  ? "Heavy block on my chest and made me wheeze"  ? Dexamethasone Other (See Comments)  ? Patient went "unresponsive"- syncope  ? Influenza Vaccines Other (See Comments)  ? "Fainting and numbness"  ? Latex Itching, Swelling, Other (See Comments)  ? Skin swells and itches  ? Losartan Other (See Comments)  ? Dizziness  ? Metronidazole Diarrhea  ? Morphine Nausea And Vomiting  ? Mycophenolate Mofetil Diarrhea  ? Omeprazole Other (See Comments)  ? Headache  ? Pneumococcal Vaccine Other (See Comments)  ? "Part of my body went numb and I could not move"  ? Polyethylene Glycol Other (See Comments)  ? Dizziness  ? Pravastatin Other (See Comments)  ? Reaction not recalled  ? Red Dye Itching  ? Shellfish-derived Products Nausea And Vomiting, Other (See Comments)  ? Mussels  ? Soybean Oil Diarrhea  ? Tetracycline Itching  ? [place]-sweet Potatoes-peaches [alimentum] Itching, Nausea Only  ? [place]-   ? Yellow Dyes (non-tartrazine) Itching  ? Zantac [ranitidine] Other (See Comments)  ? Reaction not cited  ? Erythromycin Hives, Rash  ? Gadolinium Rash  ? Neomycin-polymyxin B Gu Rash  ? Tape Rash  ? ?  ? ?  ?Medication List  ?  ? ?STOP taking these medications    ? ?HYDROcodone-acetaminophen 5-325 MG tablet ?Commonly known as: Norco ?  ?methocarbamol 500 MG tablet ?Commonly known as: ROBAXIN ?  ? ?  ? ?TAKE these medications   ? ?acetaminophen 325 MG tablet ?Commonly known as: TYLENOL ?Take 325-650 mg by mouth every 6 (six) hours as needed for mild pain or headache. ?  ?Black Currant Seed Oil 500 MG Caps ?Take 2,000 mg by mouth daily with lunch. ?  ?CHLOROXYGEN PO ?Take 1 capsule by mouth daily with lunch. ?  ?ciprofloxacin 500 MG tablet ?Commonly known as: CIPRO ?Take 1 tablet (500 mg total) by mouth 2 (two) times daily. ?  ?metoprolol tartrate 100 MG tablet ?Commonly known as: LOPRESSOR ?Take 50 mg by mouth in the morning and at bedtime. ?  ?mycophenolate 180 MG EC tablet ?Commonly known as: MYFORTIC ?Take 180 mg by mouth in the morning and  at bedtime. ?  ?NICOTINAMIDE PO ?Take 500 mg by mouth daily with lunch. ?  ?predniSONE 5 MG tablet ?Commonly known as: DELTASONE ?Take 5 mg by mouth daily with breakfast. ?  ?senna-docusate 8.6-50 MG tablet ?Commonly known as: Senokot-S ?Take 1 tablet by mouth 2 (two) times daily as needed for moderate constipation. ?  ?tacrolimus 0.5 MG capsule ?Commonly known as: PROGRAF ?Take 1.5 mg by mouth in the morning and at bedtime. ?  ?Vitamin D3 25 MCG (1000 UT) Caps ?Take 1,000 Units by mouth daily with lunch. ?  ? ?  ? ? ?Consultations: ?None ? ?Procedures/Studies: ? ? ?US Renal Transplant w/Doppler ? ?Result Date: 06/27/2021 ?CLINICAL DATA:  Left lower quadrant abdominal pain. Renal transplant EXAM: ULTRASOUND OF RENAL TRANSPLANT WITH RENAL DOPPLER ULTRASOUND TECHNIQUE: Ultrasound examination of the renal transplant was performed with gray-scale, color and duplex doppler evaluation. COMPARISON:  Noncontrast abdominopelvic CT earlier today FINDINGS: Transplant kidney location: LLQ Transplant Kidney: Renal measurements: 11.4 x 5.5 x 5.5 cm = volume: 165m. Normal in size and parenchymal echogenicity. No hydronephrosis. There is a 2.3 cm  cyst. No solid lesion. No peri-transplant fluid collection seen. Color flow in the main renal artery:  Yes Color flow in the main renal vein:  Yes Duplex Doppler Evaluation: Main Renal Artery Velocity: 108 cm/sec Main Renal A

## 2021-06-29 NOTE — Assessment & Plan Note (Signed)
H/H stable.

## 2021-06-29 NOTE — Assessment & Plan Note (Signed)
Body mass index is 44.46 kg/m?. ?Encourage lifestyle change to lose weight. ?

## 2021-06-29 NOTE — Assessment & Plan Note (Signed)
Received aztreonam for 2 days and discharged on p.o. Cipro for 4 more days. ?Pyelonephritis ruled out. ?

## 2021-06-29 NOTE — Progress Notes (Signed)
Gershon Cull, NP, notified, per 'Red med' protocol, of patient refusal of Heparin.   ?

## 2021-06-29 NOTE — Progress Notes (Signed)
Pharmacy Antibiotic Note ? ?Tasha Brown is a 56 y.o. female admitted on 06/27/2021 with UTI that failed Bactrim outpatient. Pharmacy has been consulted for ciprofloxacin dosing. Noted that patient has multiple antibiotic allergies/intolerances including intractable nausea and vomiting to cephalosporins. SCr- 1.43 with CrCl ~ 53 ml/min ? ?Plan: ?Ciprofloxacin 500 mg PO BID to complete ~ 4 more days of therapy ?Note slight DDI with tacrolimus and mycophenolate but short course affect should be minimal ?Pharmacy will sign-off and monitor  ? ?Height: 5' 3.5" (161.3 cm) ?Weight: 115.7 kg (255 lb) ?IBW/kg (Calculated) : 53.55 ? ?Temp (24hrs), Avg:97.8 ?F (36.6 ?C), Min:97.6 ?F (36.4 ?C), Max:98 ?F (36.7 ?C) ? ?Recent Labs  ?Lab 06/27/21 ?1112 06/28/21 ?8250 06/29/21 ?0339  ?WBC 7.8 6.7 6.3  ?CREATININE 1.50* 1.48* 1.43*  ?  ?Estimated Creatinine Clearance: 53.1 mL/min (A) (by C-G formula based on SCr of 1.43 mg/dL (H)).   ? ?Allergies  ?Allergen Reactions  ? Cephalosporins Nausea And Vomiting and Other (See Comments)  ?  "I threw up non-stop"  ? Iodinated Contrast Media Shortness Of Breath, Swelling and Other (See Comments)  ?  Wheezing and Pharyngeal swelling  ? Penicillins Anaphylaxis  ? Simvastatin Shortness Of Breath  ? Tetracyclines & Related Other (See Comments)  ?  Kidney MD said to NEVER take this  ? Bactroban [Mupirocin] Other (See Comments)  ?  Erythema  ? Cefepime Nausea And Vomiting  ? Crestor [Rosuvastatin] Other (See Comments)  ?  "Heavy block on my chest and made me wheeze"  ? Dexamethasone Other (See Comments)  ?  Patient went "unresponsive"- syncope  ? Influenza Vaccines Other (See Comments)  ?  "Fainting and numbness"  ? Latex Itching, Swelling and Other (See Comments)  ?  Skin swells and itches  ? Losartan Other (See Comments)  ?  Dizziness  ? Metronidazole Diarrhea  ? Morphine Nausea And Vomiting  ? Mycophenolate Mofetil Diarrhea  ? Omeprazole Other (See Comments)  ?  Headache  ? Pneumococcal Vaccine  Other (See Comments)  ?  "Part of my body went numb and I could not move"  ? Polyethylene Glycol Other (See Comments)  ?  Dizziness  ? Pravastatin Other (See Comments)  ?  Reaction not recalled  ? Red Dye Itching  ? Shellfish-Derived Products Nausea And Vomiting and Other (See Comments)  ?  Mussels  ? Soybean Oil Diarrhea  ? Tetracycline Itching  ? [place]-Sweet Potatoes-Peaches [Alimentum] Itching and Nausea Only  ?  [place]-   ? Yellow Dyes (Non-Tartrazine) Itching  ? Zantac [Ranitidine] Other (See Comments)  ?  Reaction not cited  ? Erythromycin Hives and Rash  ? Gadolinium Rash  ? Neomycin-Polymyxin B Gu Rash  ? Tape Rash  ? ? ?Thank you for allowing pharmacy to be a part of this patient?s care. ? ?Jimmy Footman, PharmD, BCPS, BCIDP ?Infectious Diseases Clinical Pharmacist ?Phone: [phone] ?06/29/2021 11:13 AM ? ?

## 2021-06-29 NOTE — Assessment & Plan Note (Signed)
-  Continue home immunosuppressants ?

## 2021-06-29 NOTE — Plan of Care (Signed)
?  Problem: Health Behavior/Discharge Planning: ?Goal: Ability to manage health-related needs will improve ?Outcome: Adequate for Discharge ?  ?Problem: Clinical Measurements: ?Goal: Ability to maintain clinical measurements within normal limits will improve ?Outcome: Adequate for Discharge ?Goal: Will remain free from infection ?Outcome: Adequate for Discharge ?Goal: Diagnostic test results will improve ?Outcome: Adequate for Discharge ?Goal: Respiratory complications will improve ?Outcome: Adequate for Discharge ?  ?Problem: Activity: ?Goal: Risk for activity intolerance will decrease ?Outcome: Adequate for Discharge ?  ?Problem: Coping: ?Goal: Level of anxiety will decrease ?Outcome: Adequate for Discharge ?  ?Problem: Elimination: ?Goal: Will not experience complications related to bowel motility ?Outcome: Adequate for Discharge ?  ?Problem: Safety: ?Goal: Ability to remain free from injury will improve ?Outcome: Adequate for Discharge ?  ?Problem: Skin Integrity: ?Goal: Risk for impaired skin integrity will decrease ?Outcome: Adequate for Discharge ?  ?Problem: Urinary Elimination: ?Goal: Signs and symptoms of infection will decrease ?Outcome: Adequate for Discharge ?  ?

## 2021-06-29 NOTE — Assessment & Plan Note (Signed)
Likely from CKD and Bactrim.  Resolved. ?

## 2021-07-02 LAB — CULTURE, BLOOD (ROUTINE X 2)
Culture: NO GROWTH
Culture: NO GROWTH
Special Requests: ADEQUATE
Special Requests: ADEQUATE

## 2021-07-07 DIAGNOSIS — I10 Essential (primary) hypertension: Secondary | ICD-10-CM | POA: Diagnosis not present

## 2021-07-07 DIAGNOSIS — Z940 Kidney transplant status: Secondary | ICD-10-CM | POA: Diagnosis not present

## 2021-07-07 DIAGNOSIS — N390 Urinary tract infection, site not specified: Secondary | ICD-10-CM | POA: Diagnosis not present

## 2021-07-07 DIAGNOSIS — H409 Unspecified glaucoma: Secondary | ICD-10-CM | POA: Diagnosis not present

## 2021-07-07 DIAGNOSIS — C4492 Squamous cell carcinoma of skin, unspecified: Secondary | ICD-10-CM | POA: Diagnosis not present

## 2021-07-07 DIAGNOSIS — N251 Nephrogenic diabetes insipidus: Secondary | ICD-10-CM | POA: Diagnosis not present

## 2021-07-12 ENCOUNTER — Other Ambulatory Visit: Payer: Self-pay | Admitting: Family Medicine

## 2021-07-12 ENCOUNTER — Other Ambulatory Visit (HOSPITAL_COMMUNITY): Payer: Self-pay | Admitting: Family Medicine

## 2021-07-12 DIAGNOSIS — C4492 Squamous cell carcinoma of skin, unspecified: Secondary | ICD-10-CM

## 2021-07-14 ENCOUNTER — Encounter (HOSPITAL_COMMUNITY)
Admission: RE | Admit: 2021-07-14 | Discharge: 2021-07-14 | Disposition: A | Discharge status: Home / Self Care | Payer: Medicare HMO | Source: Ambulatory Visit | Attending: Family Medicine | Admitting: Family Medicine

## 2021-07-14 DIAGNOSIS — C760 Malignant neoplasm of head, face and neck: Secondary | ICD-10-CM | POA: Diagnosis not present

## 2021-07-14 DIAGNOSIS — C4442 Squamous cell carcinoma of skin of scalp and neck: Secondary | ICD-10-CM | POA: Diagnosis not present

## 2021-07-14 DIAGNOSIS — C4492 Squamous cell carcinoma of skin, unspecified: Secondary | ICD-10-CM | POA: Diagnosis not present

## 2021-07-14 LAB — GLUCOSE, CAPILLARY: Glucose-Capillary: 94 mg/dL (ref 70–99)

## 2021-07-14 MED ORDER — FLUDEOXYGLUCOSE F - 18 (FDG) INJECTION
12.5000 | Freq: Once | INTRAVENOUS | Status: AC
  Administered 2021-07-14: 12.69 via INTRAVENOUS

## 2021-07-27 DIAGNOSIS — D485 Neoplasm of uncertain behavior of skin: Secondary | ICD-10-CM | POA: Diagnosis not present

## 2021-07-27 DIAGNOSIS — C44729 Squamous cell carcinoma of skin of left lower limb, including hip: Secondary | ICD-10-CM | POA: Diagnosis not present

## 2021-07-27 DIAGNOSIS — L814 Other melanin hyperpigmentation: Secondary | ICD-10-CM | POA: Diagnosis not present

## 2021-07-27 DIAGNOSIS — D171 Benign lipomatous neoplasm of skin and subcutaneous tissue of trunk: Secondary | ICD-10-CM | POA: Diagnosis not present

## 2021-07-27 DIAGNOSIS — Z85828 Personal history of other malignant neoplasm of skin: Secondary | ICD-10-CM | POA: Diagnosis not present

## 2021-07-27 DIAGNOSIS — L578 Other skin changes due to chronic exposure to nonionizing radiation: Secondary | ICD-10-CM | POA: Diagnosis not present

## 2021-07-27 DIAGNOSIS — L570 Actinic keratosis: Secondary | ICD-10-CM | POA: Diagnosis not present

## 2021-07-28 DIAGNOSIS — Z940 Kidney transplant status: Secondary | ICD-10-CM | POA: Diagnosis not present

## 2021-07-28 DIAGNOSIS — D631 Anemia in chronic kidney disease: Secondary | ICD-10-CM | POA: Diagnosis not present

## 2021-07-28 DIAGNOSIS — C4490 Unspecified malignant neoplasm of skin, unspecified: Secondary | ICD-10-CM | POA: Diagnosis not present

## 2021-07-28 DIAGNOSIS — D849 Immunodeficiency, unspecified: Secondary | ICD-10-CM | POA: Diagnosis not present

## 2021-07-28 DIAGNOSIS — I129 Hypertensive chronic kidney disease with stage 1 through stage 4 chronic kidney disease, or unspecified chronic kidney disease: Secondary | ICD-10-CM | POA: Diagnosis not present

## 2021-07-28 DIAGNOSIS — N2581 Secondary hyperparathyroidism of renal origin: Secondary | ICD-10-CM | POA: Diagnosis not present

## 2021-07-28 DIAGNOSIS — N1832 Chronic kidney disease, stage 3b: Secondary | ICD-10-CM | POA: Diagnosis not present

## 2021-07-28 DIAGNOSIS — N390 Urinary tract infection, site not specified: Secondary | ICD-10-CM | POA: Diagnosis not present

## 2021-08-17 DIAGNOSIS — Z940 Kidney transplant status: Secondary | ICD-10-CM | POA: Diagnosis not present

## 2021-08-17 DIAGNOSIS — I10 Essential (primary) hypertension: Secondary | ICD-10-CM | POA: Diagnosis not present

## 2021-08-17 DIAGNOSIS — H409 Unspecified glaucoma: Secondary | ICD-10-CM | POA: Diagnosis not present

## 2021-08-17 DIAGNOSIS — C4492 Squamous cell carcinoma of skin, unspecified: Secondary | ICD-10-CM | POA: Diagnosis not present

## 2021-08-29 ENCOUNTER — Emergency Department (HOSPITAL_COMMUNITY)
Admission: EM | Admit: 2021-08-29 | Discharge: 2021-08-29 | Disposition: A | Discharge status: Home / Self Care | Payer: No Typology Code available for payment source | Attending: Emergency Medicine | Admitting: Emergency Medicine

## 2021-08-29 ENCOUNTER — Other Ambulatory Visit: Payer: Self-pay

## 2021-08-29 ENCOUNTER — Encounter (HOSPITAL_COMMUNITY): Payer: Self-pay

## 2021-08-29 ENCOUNTER — Emergency Department (HOSPITAL_COMMUNITY): Payer: No Typology Code available for payment source

## 2021-08-29 DIAGNOSIS — Z79899 Other long term (current) drug therapy: Secondary | ICD-10-CM | POA: Insufficient documentation

## 2021-08-29 DIAGNOSIS — R519 Headache, unspecified: Secondary | ICD-10-CM

## 2021-08-29 DIAGNOSIS — I159 Secondary hypertension, unspecified: Secondary | ICD-10-CM

## 2021-08-29 DIAGNOSIS — R000 Tachycardia, unspecified: Secondary | ICD-10-CM | POA: Diagnosis not present

## 2021-08-29 DIAGNOSIS — Z91040 Latex allergy status: Secondary | ICD-10-CM | POA: Diagnosis not present

## 2021-08-29 DIAGNOSIS — I10 Essential (primary) hypertension: Secondary | ICD-10-CM | POA: Diagnosis not present

## 2021-08-29 DIAGNOSIS — G4489 Other headache syndrome: Secondary | ICD-10-CM | POA: Diagnosis not present

## 2021-08-29 DIAGNOSIS — R52 Pain, unspecified: Secondary | ICD-10-CM | POA: Diagnosis not present

## 2021-08-29 LAB — CBC WITH DIFFERENTIAL/PLATELET
Abs Immature Granulocytes: 0.03 10*3/uL (ref 0.00–0.07)
Basophils Absolute: 0 10*3/uL (ref 0.0–0.1)
Basophils Relative: 0 %
Eosinophils Absolute: 0.2 10*3/uL (ref 0.0–0.5)
Eosinophils Relative: 2 %
HCT: 34.1 % — ABNORMAL LOW (ref 36.0–46.0)
Hemoglobin: 10.6 g/dL — ABNORMAL LOW (ref 12.0–15.0)
Immature Granulocytes: 0 %
Lymphocytes Relative: 23 %
Lymphs Abs: 1.9 10*3/uL (ref 0.7–4.0)
MCH: 29.6 pg (ref 26.0–34.0)
MCHC: 31.1 g/dL (ref 30.0–36.0)
MCV: 95.3 fL (ref 80.0–100.0)
Monocytes Absolute: 0.5 10*3/uL (ref 0.1–1.0)
Monocytes Relative: 7 %
Neutro Abs: 5.7 10*3/uL (ref 1.7–7.7)
Neutrophils Relative %: 68 %
Platelets: 169 10*3/uL (ref 150–400)
RBC: 3.58 MIL/uL — ABNORMAL LOW (ref 3.87–5.11)
RDW: 13.7 % (ref 11.5–15.5)
WBC: 8.4 10*3/uL (ref 4.0–10.5)
nRBC: 0 % (ref 0.0–0.2)

## 2021-08-29 LAB — BASIC METABOLIC PANEL
Anion gap: 6 (ref 5–15)
BUN: 26 mg/dL — ABNORMAL HIGH (ref 6–20)
CO2: 27 mmol/L (ref 22–32)
Calcium: 9.1 mg/dL (ref 8.9–10.3)
Chloride: 106 mmol/L (ref 98–111)
Creatinine, Ser: 1.32 mg/dL — ABNORMAL HIGH (ref 0.44–1.00)
GFR, Estimated: 47 mL/min — ABNORMAL LOW (ref >60)
Glucose, Bld: 100 mg/dL — ABNORMAL HIGH (ref 70–99)
Potassium: 3.9 mmol/L (ref 3.5–5.1)
Sodium: 139 mmol/L (ref 135–145)

## 2021-08-29 LAB — URINALYSIS, ROUTINE W REFLEX MICROSCOPIC
Bacteria, UA: NONE SEEN
Bilirubin Urine: NEGATIVE
Glucose, UA: NEGATIVE mg/dL
Ketones, ur: NEGATIVE mg/dL
Nitrite: NEGATIVE
Protein, ur: 30 mg/dL — AB
Specific Gravity, Urine: 1.008 (ref 1.005–1.030)
pH: 6 (ref 5.0–8.0)

## 2021-08-29 MED ORDER — FENTANYL CITRATE PF 50 MCG/ML IJ SOSY
50.0000 ug | PREFILLED_SYRINGE | Freq: Once | INTRAMUSCULAR | Status: AC
  Administered 2021-08-29: 50 ug via INTRAVENOUS
  Filled 2021-08-29: qty 1

## 2021-08-29 MED ORDER — DIPHENHYDRAMINE HCL 50 MG/ML IJ SOLN
12.5000 mg | Freq: Once | INTRAMUSCULAR | Status: AC
Start: 2021-08-29 — End: 2021-08-29
  Administered 2021-08-29: 12.5 mg via INTRAVENOUS
  Filled 2021-08-29: qty 1

## 2021-08-29 MED ORDER — SODIUM CHLORIDE 0.9 % IV SOLN: INTRAVENOUS | Status: DC

## 2021-08-29 MED ORDER — METOCLOPRAMIDE HCL 5 MG/ML IJ SOLN
10.0000 mg | Freq: Once | INTRAMUSCULAR | Status: AC
  Administered 2021-08-29: 10 mg via INTRAVENOUS
  Filled 2021-08-29: qty 2

## 2021-08-29 NOTE — ED Provider Notes (Signed)
[facility] DEPT Provider Note   CSN: [id number] Arrival date & time: 08/29/21  0525     History  Chief Complaint  Patient presents with   Headache    Tasha Brown is a 56 y.o. female.  56 year old female presents with diffuse headache which began yesterday evening.  Patient states that she began to notice increased blood pressure at home.  Systolic of 998 diastolic of 338.  Recently had her medications for hypertension changed by her PCP.  Patient took 50 mg metoprolol yesterday evening and when her blood pressure did not decrease she took an additional 25 mg.  She had nausea but no vomiting.  Denies any focal weakness.  No confusion associated with this.       Home Medications Prior to Admission medications   Medication Sig Start Date End Date Taking? Authorizing Provider  acetaminophen (TYLENOL) 325 MG tablet Take 325-650 mg by mouth every 6 (six) hours as needed for mild pain or headache.    [provider]  Black Currant Seed Oil 500 MG CAPS Take 2,000 mg by mouth daily with lunch.    [provider]  Chlorophyll (CHLOROXYGEN PO) Take 1 capsule by mouth daily with lunch.    [provider]  Cholecalciferol (VITAMIN D3) 25 MCG (1000 UT) CAPS Take 1,000 Units by mouth daily with lunch.    [provider]  ciprofloxacin (CIPRO) 500 MG tablet Take 1 tablet (500 mg total) by mouth 2 (two) times daily. 06/29/21   Mercy Riding, MD  metoprolol tartrate (LOPRESSOR) 100 MG tablet Take 50 mg by mouth in the morning and at bedtime.    [provider]  mycophenolate (MYFORTIC) 180 MG EC tablet Take 180 mg by mouth in the morning and at bedtime.    [provider]  Niacinamide-Zn-Cu-Methfo-Se-Cr (NICOTINAMIDE PO) Take 500 mg by mouth daily with lunch.    [provider]  predniSONE (DELTASONE) 5 MG tablet Take 5 mg by mouth daily with breakfast.    [provider]  senna-docusate  (SENOKOT-S) 8.6-50 MG tablet Take 1 tablet by mouth 2 (two) times daily as needed for moderate constipation. 06/29/21   Mercy Riding, MD  tacrolimus (PROGRAF) 0.5 MG capsule Take 1.5 mg by mouth in the morning and at bedtime.    [provider]      Allergies    Cephalosporins, Iodinated contrast media, Penicillins, Simvastatin, Tetracyclines & related, Bactroban [mupirocin], Cefepime, Crestor [rosuvastatin], Dexamethasone, Influenza vaccines, Latex, Losartan, Metronidazole, Morphine, Mycophenolate mofetil, Omeprazole, Pneumococcal vaccine, Polyethylene glycol, Pravastatin, Red dye, Shellfish-derived products, Soybean oil, Tetracycline, Turkey-sweet potatoes-peaches [alimentum], Yellow dyes (non-tartrazine), Zantac [ranitidine], Erythromycin, Gadolinium, Neomycin-polymyxin b gu, and Tape    Review of Systems   Review of Systems  All other systems reviewed and are negative.   Physical Exam Updated Vital Signs BP (!) 163/83   Pulse (!) 52   Temp 97.8 F (36.6 C) (Oral)   Resp 18   Ht 1.626 m ('5\' 4"'$ )   Wt 115.7 kg   SpO2 99%   BMI 43.77 kg/m  Physical Exam Vitals and nursing note reviewed.  Constitutional:      General: She is not in acute distress.    Appearance: Normal appearance. She is well-developed. She is not toxic-appearing.  HENT:     Head: Normocephalic and atraumatic.  Eyes:     General: Lids are normal.     Conjunctiva/sclera: Conjunctivae normal.     Pupils: Pupils are equal, round, and  reactive to light.  Neck:     Thyroid: No thyroid mass.     Trachea: No tracheal deviation.  Cardiovascular:     Rate and Rhythm: Normal rate and regular rhythm.     Heart sounds: Normal heart sounds. No murmur heard.    No gallop.  Pulmonary:     Effort: Pulmonary effort is normal. No respiratory distress.     Breath sounds: Normal breath sounds. No stridor. No decreased breath sounds, wheezing, rhonchi or rales.  Abdominal:     General: There is no distension.      Palpations: Abdomen is soft.     Tenderness: There is no abdominal tenderness. There is no rebound.  Musculoskeletal:        General: No tenderness. Normal range of motion.     Cervical back: Normal range of motion and neck supple.  Skin:    General: Skin is warm and dry.     Findings: No abrasion or rash.  Neurological:     Mental Status: She is alert and oriented to person, place, and time. Mental status is at baseline.     GCS: GCS eye subscore is 4. GCS verbal subscore is 5. GCS motor subscore is 6.     Cranial Nerves: No cranial nerve deficit.     Sensory: No sensory deficit.     Motor: Motor function is intact.  Psychiatric:        Attention and Perception: Attention normal.        Speech: Speech normal.        Behavior: Behavior normal.     ED Results / Procedures / Treatments   Labs (all labs ordered are listed, but only abnormal results are displayed) Labs Reviewed  CBC WITH DIFFERENTIAL/PLATELET  BASIC METABOLIC PANEL  URINALYSIS, ROUTINE W REFLEX MICROSCOPIC    EKG None  Radiology No results found.  Procedures Procedures    Medications Ordered in ED Medications  0.9 %  sodium chloride infusion (has no administration in time range)  metoCLOPramide (REGLAN) injection 10 mg (has no administration in time range)  diphenhydrAMINE (BENADRYL) injection 12.5 mg (has no administration in time range)    ED Course/ Medical Decision Making/ A&P                           Medical Decision Making Amount and/or Complexity of Data Reviewed Labs: ordered. Radiology: ordered.  Risk Prescription drug management.   Patient here complaint of hypertension.  Took an extra dose of her antihypertensive.  Blood pressure has decreased here.  Medicated for pain does feel better.  Head CT per my interpretation showed no acute signs of bleeding.  She has no neurological deficits on exam.  Labs are reassuring including no change in her baseline renal function.  Patient was  concerned about possible UTI urinalysis negative.  Plan will be for her to go home and call her physician for blood pressure management        Final Clinical Impression(s) / ED Diagnoses Final diagnoses:  None    Rx / DC Orders ED Discharge Orders     None         Lacretia Leigh, MD 08/29/21 1045

## 2021-08-29 NOTE — ED Triage Notes (Signed)
Pt BIB EMS with c/o headache that began around 0030. Pt hypertensive upon EMS arrival 180/100, took 50 mg metoprolol (25 mg above prescribed dose) and tylenol. Pt denies relief from headache, no hx of migraines.

## 2021-09-06 DIAGNOSIS — R351 Nocturia: Secondary | ICD-10-CM | POA: Diagnosis not present

## 2021-09-06 DIAGNOSIS — R350 Frequency of micturition: Secondary | ICD-10-CM | POA: Diagnosis not present

## 2021-09-06 DIAGNOSIS — N390 Urinary tract infection, site not specified: Secondary | ICD-10-CM | POA: Diagnosis not present

## 2021-09-19 DIAGNOSIS — Z940 Kidney transplant status: Secondary | ICD-10-CM | POA: Diagnosis not present

## 2021-09-19 DIAGNOSIS — I10 Essential (primary) hypertension: Secondary | ICD-10-CM | POA: Diagnosis not present

## 2021-09-19 DIAGNOSIS — Z1231 Encounter for screening mammogram for malignant neoplasm of breast: Secondary | ICD-10-CM | POA: Diagnosis not present

## 2021-09-19 DIAGNOSIS — I771 Stricture of artery: Secondary | ICD-10-CM | POA: Diagnosis not present

## 2021-09-22 ENCOUNTER — Other Ambulatory Visit: Payer: Self-pay | Admitting: Family Medicine

## 2021-09-22 DIAGNOSIS — Z1231 Encounter for screening mammogram for malignant neoplasm of breast: Secondary | ICD-10-CM

## 2021-09-28 ENCOUNTER — Ambulatory Visit
Admission: RE | Admit: 2021-09-28 | Discharge: 2021-09-28 | Disposition: A | Discharge status: Home / Self Care | Payer: Medicare HMO | Source: Ambulatory Visit | Attending: Family Medicine | Admitting: Family Medicine

## 2021-09-28 DIAGNOSIS — Z1231 Encounter for screening mammogram for malignant neoplasm of breast: Secondary | ICD-10-CM

## 2021-09-30 ENCOUNTER — Other Ambulatory Visit: Payer: Self-pay | Admitting: Family Medicine

## 2021-09-30 DIAGNOSIS — N6310 Unspecified lump in the right breast, unspecified quadrant: Secondary | ICD-10-CM

## 2021-10-14 DIAGNOSIS — C4442 Squamous cell carcinoma of skin of scalp and neck: Secondary | ICD-10-CM | POA: Diagnosis not present

## 2021-10-20 ENCOUNTER — Ambulatory Visit: Payer: No Typology Code available for payment source

## 2021-10-20 ENCOUNTER — Ambulatory Visit
Admission: RE | Admit: 2021-10-20 | Discharge: 2021-10-20 | Disposition: A | Discharge status: Home / Self Care | Payer: Medicare HMO | Source: Ambulatory Visit | Attending: Family Medicine | Admitting: Family Medicine

## 2021-10-20 DIAGNOSIS — R928 Other abnormal and inconclusive findings on diagnostic imaging of breast: Secondary | ICD-10-CM | POA: Diagnosis not present

## 2021-10-20 DIAGNOSIS — N6310 Unspecified lump in the right breast, unspecified quadrant: Secondary | ICD-10-CM

## 2021-10-28 DIAGNOSIS — I129 Hypertensive chronic kidney disease with stage 1 through stage 4 chronic kidney disease, or unspecified chronic kidney disease: Secondary | ICD-10-CM | POA: Diagnosis not present

## 2021-10-28 DIAGNOSIS — N2581 Secondary hyperparathyroidism of renal origin: Secondary | ICD-10-CM | POA: Diagnosis not present

## 2021-10-28 DIAGNOSIS — D631 Anemia in chronic kidney disease: Secondary | ICD-10-CM | POA: Diagnosis not present

## 2021-10-28 DIAGNOSIS — D849 Immunodeficiency, unspecified: Secondary | ICD-10-CM | POA: Diagnosis not present

## 2021-10-28 DIAGNOSIS — C4490 Unspecified malignant neoplasm of skin, unspecified: Secondary | ICD-10-CM | POA: Diagnosis not present

## 2021-10-28 DIAGNOSIS — Z940 Kidney transplant status: Secondary | ICD-10-CM | POA: Diagnosis not present

## 2021-10-28 DIAGNOSIS — N1832 Chronic kidney disease, stage 3b: Secondary | ICD-10-CM | POA: Diagnosis not present

## 2021-11-01 DIAGNOSIS — H40013 Open angle with borderline findings, low risk, bilateral: Secondary | ICD-10-CM | POA: Diagnosis not present

## 2021-11-01 DIAGNOSIS — H25813 Combined forms of age-related cataract, bilateral: Secondary | ICD-10-CM | POA: Diagnosis not present

## 2021-11-01 DIAGNOSIS — R7309 Other abnormal glucose: Secondary | ICD-10-CM | POA: Diagnosis not present

## 2021-11-01 DIAGNOSIS — H524 Presbyopia: Secondary | ICD-10-CM | POA: Diagnosis not present

## 2021-11-01 DIAGNOSIS — H40032 Anatomical narrow angle, left eye: Secondary | ICD-10-CM | POA: Diagnosis not present

## 2021-11-07 DIAGNOSIS — H40032 Anatomical narrow angle, left eye: Secondary | ICD-10-CM | POA: Diagnosis not present

## 2021-12-19 DIAGNOSIS — H40032 Anatomical narrow angle, left eye: Secondary | ICD-10-CM | POA: Diagnosis not present

## 2021-12-20 DIAGNOSIS — L570 Actinic keratosis: Secondary | ICD-10-CM | POA: Diagnosis not present

## 2021-12-20 DIAGNOSIS — D1801 Hemangioma of skin and subcutaneous tissue: Secondary | ICD-10-CM | POA: Diagnosis not present

## 2021-12-20 DIAGNOSIS — Z85828 Personal history of other malignant neoplasm of skin: Secondary | ICD-10-CM | POA: Diagnosis not present

## 2021-12-20 DIAGNOSIS — L814 Other melanin hyperpigmentation: Secondary | ICD-10-CM | POA: Diagnosis not present

## 2022-03-24 DIAGNOSIS — C4492 Squamous cell carcinoma of skin, unspecified: Secondary | ICD-10-CM | POA: Diagnosis not present

## 2022-03-24 DIAGNOSIS — Z940 Kidney transplant status: Secondary | ICD-10-CM | POA: Diagnosis not present

## 2022-03-24 DIAGNOSIS — N251 Nephrogenic diabetes insipidus: Secondary | ICD-10-CM | POA: Diagnosis not present

## 2022-03-24 DIAGNOSIS — H409 Unspecified glaucoma: Secondary | ICD-10-CM | POA: Diagnosis not present

## 2022-03-24 DIAGNOSIS — I771 Stricture of artery: Secondary | ICD-10-CM | POA: Diagnosis not present

## 2022-03-24 DIAGNOSIS — Z0000 Encounter for general adult medical examination without abnormal findings: Secondary | ICD-10-CM | POA: Diagnosis not present

## 2022-03-24 DIAGNOSIS — R946 Abnormal results of thyroid function studies: Secondary | ICD-10-CM | POA: Diagnosis not present

## 2022-03-24 DIAGNOSIS — I10 Essential (primary) hypertension: Secondary | ICD-10-CM | POA: Diagnosis not present

## 2022-05-08 DIAGNOSIS — H25813 Combined forms of age-related cataract, bilateral: Secondary | ICD-10-CM | POA: Diagnosis not present

## 2022-05-08 DIAGNOSIS — H40013 Open angle with borderline findings, low risk, bilateral: Secondary | ICD-10-CM | POA: Diagnosis not present

## 2022-05-08 DIAGNOSIS — H40032 Anatomical narrow angle, left eye: Secondary | ICD-10-CM | POA: Diagnosis not present

## 2022-05-08 DIAGNOSIS — H524 Presbyopia: Secondary | ICD-10-CM | POA: Diagnosis not present

## 2022-05-08 DIAGNOSIS — R7309 Other abnormal glucose: Secondary | ICD-10-CM | POA: Diagnosis not present

## 2022-05-23 DIAGNOSIS — N251 Nephrogenic diabetes insipidus: Secondary | ICD-10-CM | POA: Diagnosis not present

## 2022-05-23 DIAGNOSIS — H409 Unspecified glaucoma: Secondary | ICD-10-CM | POA: Diagnosis not present

## 2022-05-23 DIAGNOSIS — E038 Other specified hypothyroidism: Secondary | ICD-10-CM | POA: Diagnosis not present

## 2022-05-23 DIAGNOSIS — I10 Essential (primary) hypertension: Secondary | ICD-10-CM | POA: Diagnosis not present

## 2022-07-23 DIAGNOSIS — H60392 Other infective otitis externa, left ear: Secondary | ICD-10-CM | POA: Diagnosis not present

## 2022-09-07 ENCOUNTER — Other Ambulatory Visit: Payer: Self-pay | Admitting: Family Medicine

## 2022-09-07 DIAGNOSIS — Z1231 Encounter for screening mammogram for malignant neoplasm of breast: Secondary | ICD-10-CM

## 2022-09-22 DIAGNOSIS — D84821 Immunodeficiency due to drugs: Secondary | ICD-10-CM | POA: Diagnosis not present

## 2022-09-22 DIAGNOSIS — N251 Nephrogenic diabetes insipidus: Secondary | ICD-10-CM | POA: Diagnosis not present

## 2022-09-22 DIAGNOSIS — Z940 Kidney transplant status: Secondary | ICD-10-CM | POA: Diagnosis not present

## 2022-09-22 DIAGNOSIS — I771 Stricture of artery: Secondary | ICD-10-CM | POA: Diagnosis not present

## 2022-09-22 DIAGNOSIS — R7303 Prediabetes: Secondary | ICD-10-CM | POA: Diagnosis not present

## 2022-09-22 DIAGNOSIS — E038 Other specified hypothyroidism: Secondary | ICD-10-CM | POA: Diagnosis not present

## 2022-09-22 DIAGNOSIS — C4492 Squamous cell carcinoma of skin, unspecified: Secondary | ICD-10-CM | POA: Diagnosis not present

## 2022-09-22 DIAGNOSIS — H409 Unspecified glaucoma: Secondary | ICD-10-CM | POA: Diagnosis not present

## 2022-09-22 DIAGNOSIS — I10 Essential (primary) hypertension: Secondary | ICD-10-CM | POA: Diagnosis not present

## 2022-09-28 DIAGNOSIS — L821 Other seborrheic keratosis: Secondary | ICD-10-CM | POA: Diagnosis not present

## 2022-09-28 DIAGNOSIS — C44629 Squamous cell carcinoma of skin of left upper limb, including shoulder: Secondary | ICD-10-CM | POA: Diagnosis not present

## 2022-09-28 DIAGNOSIS — D225 Melanocytic nevi of trunk: Secondary | ICD-10-CM | POA: Diagnosis not present

## 2022-09-28 DIAGNOSIS — L570 Actinic keratosis: Secondary | ICD-10-CM | POA: Diagnosis not present

## 2022-09-28 DIAGNOSIS — Z85828 Personal history of other malignant neoplasm of skin: Secondary | ICD-10-CM | POA: Diagnosis not present

## 2022-09-28 DIAGNOSIS — D171 Benign lipomatous neoplasm of skin and subcutaneous tissue of trunk: Secondary | ICD-10-CM | POA: Diagnosis not present

## 2022-09-28 DIAGNOSIS — C44722 Squamous cell carcinoma of skin of right lower limb, including hip: Secondary | ICD-10-CM | POA: Diagnosis not present

## 2022-09-28 DIAGNOSIS — D485 Neoplasm of uncertain behavior of skin: Secondary | ICD-10-CM | POA: Diagnosis not present

## 2022-10-11 DIAGNOSIS — R599 Enlarged lymph nodes, unspecified: Secondary | ICD-10-CM | POA: Diagnosis not present

## 2022-10-11 DIAGNOSIS — Z85828 Personal history of other malignant neoplasm of skin: Secondary | ICD-10-CM | POA: Diagnosis not present

## 2022-10-23 ENCOUNTER — Ambulatory Visit
Admission: RE | Admit: 2022-10-23 | Discharge: 2022-10-23 | Disposition: A | Discharge status: Home / Self Care | Payer: Medicare HMO | Source: Ambulatory Visit | Attending: Family Medicine | Admitting: Family Medicine

## 2022-10-23 DIAGNOSIS — Z1231 Encounter for screening mammogram for malignant neoplasm of breast: Secondary | ICD-10-CM | POA: Diagnosis not present

## 2022-10-24 DIAGNOSIS — C44722 Squamous cell carcinoma of skin of right lower limb, including hip: Secondary | ICD-10-CM | POA: Diagnosis not present

## 2022-10-24 DIAGNOSIS — Z85828 Personal history of other malignant neoplasm of skin: Secondary | ICD-10-CM | POA: Diagnosis not present

## 2022-10-28 DIAGNOSIS — R509 Fever, unspecified: Secondary | ICD-10-CM | POA: Diagnosis not present

## 2022-10-28 DIAGNOSIS — R1011 Right upper quadrant pain: Secondary | ICD-10-CM | POA: Diagnosis not present

## 2022-10-28 DIAGNOSIS — R197 Diarrhea, unspecified: Secondary | ICD-10-CM | POA: Diagnosis not present

## 2022-10-28 DIAGNOSIS — R5383 Other fatigue: Secondary | ICD-10-CM | POA: Diagnosis not present

## 2022-10-30 DIAGNOSIS — R197 Diarrhea, unspecified: Secondary | ICD-10-CM | POA: Diagnosis not present

## 2022-10-31 DIAGNOSIS — C44629 Squamous cell carcinoma of skin of left upper limb, including shoulder: Secondary | ICD-10-CM | POA: Diagnosis not present

## 2022-10-31 DIAGNOSIS — Z85828 Personal history of other malignant neoplasm of skin: Secondary | ICD-10-CM | POA: Diagnosis not present

## 2023-07-14 LAB — COLOGUARD: COLOGUARD: POSITIVE — AB
